# Patient Record
Sex: Female | Born: 1970 | Race: White | Hispanic: Yes | Marital: Married | Smoking: Never smoker
Health system: Southern US, Community
[De-identification: ages and names within clinical notes are randomized; demographics above are authoritative.]

## PROBLEM LIST (undated history)

## (undated) DIAGNOSIS — O24419 Gestational diabetes mellitus in pregnancy, unspecified control: Secondary | ICD-10-CM

## (undated) HISTORY — PX: NO PAST SURGERIES: SHX2092

## (undated) HISTORY — DX: Gestational diabetes mellitus in pregnancy, unspecified control: O24.419

---

## 2005-07-04 ENCOUNTER — Inpatient Hospital Stay (HOSPITAL_COMMUNITY): Admission: AD | Admit: 2005-07-04 | Discharge: 2005-07-06 | Payer: Self-pay | Admitting: Obstetrics & Gynecology

## 2011-12-22 NOTE — L&D Delivery Note (Signed)
Delivery Note Pt is a 40 y.o. [id number] and [redacted]w[redacted]d presenting to [facility] this morning with unexplained fever and fetal tachycardia. Her cervix was favorable, and she is a diet controlled gestational diabetic. Therefore she was admitted for induction with pitocin. She progressed well with low dose pitocin. An epidural was placed and pt went on to deliver vaginally. Fluid was bloody on AROM just before delivery and several clots were passed with delivery of the head.  Pt remained afebrile during labor. At 7:42 PM a viable and healthy female was delivered via SVD (Presentation: OA).  APGAR: 8, 9; weight 5 lb 14.4 oz (2676 g).   Placenta status: some small clots, spontaneous delivery, intact, sent for path.  Cord: normal.  Complications: none.  Cord pH: n/a.   Anesthesia:  epidural Episiotomy: none Lacerations: none Suture Repair: n/a Est. Blood Loss (mL):   Mom to postpartum.  Baby to nursery-stable.  Napoleon Form 08/02/2012, 7:55 PM

## 2012-04-25 ENCOUNTER — Other Ambulatory Visit (HOSPITAL_COMMUNITY): Payer: Self-pay | Admitting: Physician Assistant

## 2012-04-25 DIAGNOSIS — Z3689 Encounter for other specified antenatal screening: Secondary | ICD-10-CM

## 2012-04-25 LAB — OB RESULTS CONSOLE HGB/HCT, BLOOD: Hemoglobin: 11.2 g/dL

## 2012-04-25 LAB — OB RESULTS CONSOLE ABO/RH: RH Type: POSITIVE

## 2012-04-25 LAB — OB RESULTS CONSOLE VARICELLA ZOSTER ANTIBODY, IGG: Varicella: IMMUNE

## 2012-04-25 LAB — OB RESULTS CONSOLE HIV ANTIBODY (ROUTINE TESTING): HIV: NONREACTIVE

## 2012-04-25 LAB — OB RESULTS CONSOLE PLATELET COUNT: Platelets: 184 10*3/uL

## 2012-04-25 LAB — OB RESULTS CONSOLE GC/CHLAMYDIA: Gonorrhea: NEGATIVE

## 2012-04-25 LAB — OB RESULTS CONSOLE ANTIBODY SCREEN: Antibody Screen: NEGATIVE

## 2012-04-25 LAB — GLUCOSE TOLERANCE, 1 HOUR: Glucose, 1 Hour GTT: 164

## 2012-04-27 ENCOUNTER — Ambulatory Visit (HOSPITAL_COMMUNITY)
Admission: RE | Admit: 2012-04-27 | Discharge: 2012-04-27 | Disposition: A | Discharge status: Home / Self Care | Payer: Medicaid Other | Source: Ambulatory Visit | Attending: Physician Assistant | Admitting: Physician Assistant

## 2012-04-27 DIAGNOSIS — O09529 Supervision of elderly multigravida, unspecified trimester: Secondary | ICD-10-CM | POA: Insufficient documentation

## 2012-04-27 DIAGNOSIS — O3680X0 Pregnancy with inconclusive fetal viability, not applicable or unspecified: Secondary | ICD-10-CM | POA: Insufficient documentation

## 2012-04-27 DIAGNOSIS — Z1389 Encounter for screening for other disorder: Secondary | ICD-10-CM | POA: Insufficient documentation

## 2012-04-27 DIAGNOSIS — Z3689 Encounter for other specified antenatal screening: Secondary | ICD-10-CM

## 2012-04-27 DIAGNOSIS — O358XX0 Maternal care for other (suspected) fetal abnormality and damage, not applicable or unspecified: Secondary | ICD-10-CM | POA: Insufficient documentation

## 2012-04-27 DIAGNOSIS — Z363 Encounter for antenatal screening for malformations: Secondary | ICD-10-CM | POA: Insufficient documentation

## 2012-04-29 ENCOUNTER — Other Ambulatory Visit (HOSPITAL_COMMUNITY): Payer: Self-pay | Admitting: Physician Assistant

## 2012-04-29 DIAGNOSIS — Z3689 Encounter for other specified antenatal screening: Secondary | ICD-10-CM

## 2012-04-29 LAB — GLUCOSE, 3 HOUR GESTATIONAL: Glucose, GTT - 2 Hour: 183 mg/dL — AB (ref ?–140)

## 2012-05-05 ENCOUNTER — Other Ambulatory Visit (HOSPITAL_COMMUNITY): Payer: Self-pay | Admitting: Physician Assistant

## 2012-05-05 ENCOUNTER — Ambulatory Visit (HOSPITAL_COMMUNITY)
Admission: RE | Admit: 2012-05-05 | Discharge: 2012-05-05 | Disposition: A | Discharge status: Home / Self Care | Payer: Medicaid Other | Source: Ambulatory Visit | Attending: Physician Assistant | Admitting: Physician Assistant

## 2012-05-05 DIAGNOSIS — Z3689 Encounter for other specified antenatal screening: Secondary | ICD-10-CM

## 2012-05-05 DIAGNOSIS — O0930 Supervision of pregnancy with insufficient antenatal care, unspecified trimester: Secondary | ICD-10-CM | POA: Insufficient documentation

## 2012-05-05 DIAGNOSIS — O09529 Supervision of elderly multigravida, unspecified trimester: Secondary | ICD-10-CM | POA: Insufficient documentation

## 2012-05-09 ENCOUNTER — Encounter: Payer: Medicaid Other | Attending: Family Medicine | Admitting: Dietician

## 2012-05-09 ENCOUNTER — Ambulatory Visit (INDEPENDENT_AMBULATORY_CARE_PROVIDER_SITE_OTHER): Payer: Self-pay | Admitting: Family Medicine

## 2012-05-09 ENCOUNTER — Encounter: Payer: Self-pay | Admitting: Family Medicine

## 2012-05-09 VITALS — BP 122/75 | Temp 97.3°F | Ht 60.25 in | Wt 158.2 lb

## 2012-05-09 DIAGNOSIS — O99810 Abnormal glucose complicating pregnancy: Secondary | ICD-10-CM | POA: Insufficient documentation

## 2012-05-09 DIAGNOSIS — Z713 Dietary counseling and surveillance: Secondary | ICD-10-CM | POA: Insufficient documentation

## 2012-05-09 DIAGNOSIS — O24419 Gestational diabetes mellitus in pregnancy, unspecified control: Secondary | ICD-10-CM

## 2012-05-09 DIAGNOSIS — O0990 Supervision of high risk pregnancy, unspecified, unspecified trimester: Secondary | ICD-10-CM | POA: Insufficient documentation

## 2012-05-09 DIAGNOSIS — Z22330 Carrier of Group B streptococcus: Secondary | ICD-10-CM

## 2012-05-09 DIAGNOSIS — O99820 Streptococcus B carrier state complicating pregnancy: Secondary | ICD-10-CM | POA: Insufficient documentation

## 2012-05-09 LAB — POCT URINALYSIS DIP (DEVICE)
Ketones, ur: NEGATIVE mg/dL
Nitrite: NEGATIVE
Protein, ur: NEGATIVE mg/dL
pH: 7 (ref 5.0–8.0)

## 2012-05-09 NOTE — Patient Instructions (Signed)
Diabetes mellitus gestacional (Gestational Diabetes Mellitus) La diabetes mellitus gestacional se produce slo durante el embarazo. Aparece cuando el organismo no puede controlar adecuadamente la glucosa (azcar) que aumenta en la sangre despus de comer. Durante el embarazo, se produce una resistencia a la insulina (sensibilidad reducida a la insulina) debido a la liberacin de hormonas por parte de la placenta. Generalmente, el pncreas de una mujer embarazada produce la cantidad suficiente de insulina para vencer esa resistencia. Sin embargo, en la diabetes gestacional, hay insulina pero no cumple su funcin adecuadamente. Si la resistencia es lo suficientemente grave como para que el pncreas no produzca la cantidad de insulina suficiente, la glucosa extra se acumula en la sangre.  QUINES TIENEN RIESGO DE DESARROLLAR DIABETES GESTACIONAL?  Las mujeres con historia de diabetes en la familia.   Las mujeres de ms de 25 aos.   Las que presentan sobrepeso.   Las mujeres que pertenecen a ciertos grupos tnicos (latinas, afroamericanas, norteamericanas nativas, asiticas y las originarias de las islas del [employer].  QUE PUEDE OCURRIRLE AL BEB? Si el nivel de glucosa en sangre de la madre es demasiado elevado mientras este embarazada, el nivel extra de azcar pasar por el cordn umbilical hacia el beb. Algunos de los problemas del beb pueden ser:  Beb demasiado grande: si el nio recibe demasiada azcar, puede aumentar mucho de peso. Esto puede hacer que sea demasiado grande para nacer por parto normal (vaginal) por lo que ser necesario realizar una cesrea.   Bajo nivel de glucosa (hipoglucemia): el beb produce insulina extra en respuesta a la excesiva cantidad de azcar que obtiene de la madre. Cuando el beb nace y ya no necesita insulina extra, su nivel de azcar en sangre puede disminuir.   Ictericia (coloracin amarillenta de la piel y los ojos): esto es bastante frecuente en los  bebs. La causa es la acumulacin de una sustancia qumica denominada bilirrubina. No siempre es un trastorno grave, pero se observa con frecuencia en los bebs cuyas madres sufren diabetes gestacional.  RIESGOS PARA LA MADRE Las mujeres que han sufrido diabetes gestacional pueden tener ms riesgos para algunos problemas como:  Preeclampsia o toxemia, incluyendo problemas con hipertensin arterial. La presin arterial y los niveles de protenas en la orina deben controlarse con frecuencia.   Infecciones   Parto por cesrea.   Aparicin de diabetes tipo 2 en una etapa posterior de la vida. Alrededor del 30% al 50% sufrir diabetes posteriormente, especialmente las que son obesas.  DIAGNSTICO Las hormonas que causan resistencia a la insulina tienen su mayor nivel alrededor de las 24 a 28 semanas del embarazo. Si se experimentan sntomas, stos son similares a los sntomas que normalmente aparecen durante el embarazo.  La diabetes mellitus gestacional generalmente se diagnostica por medio de un mtodo en dos partes: 1. Despus de la 24 a 28 semanas de embarazo, la mujer debe beber una solucin que contiene glucosa y realizar un anlisis de sangre. Si el nivel de glucosa es elevado, la realizarn un segundo anlisis.  2. La prueba oral de tolerancia a la glucosa, que dura aproximadamente tres horas. Despus de realizar ayuno durante la noche, se controla nivel de glucosa en sangre. La mujer bebe una solucin que contiene glucosa y le realizan anlisis de glucosa en sangre cada hora.  Si la mujer tiene factores de riesgos para la diabetes mellitus gestacional, el mdico podr indicar el anlisis antes de las 24 semanas de embarazo. TRATAMIENTO El tratamiento est dirigido a mantener la glucosa en   sangre de la madre en un nivel normal y puede incluir:  La planificacin de los alimentos.   Recibir insulina u otro medicamento para controlar el nivel de glucosa en sangre.   La prctica de ejercicios.    Llevar un registro diario de los alimentos que consume.   Control y registro de los niveles de glucosa en sangre.   Control de los niveles de cetona en la orina, aunque esto ya no se considera necesario en la mayora de los embarazos.  INSTRUCCIONES PARA EL CUIDADO DOMICILIARIO Mientras est embarazada:  Siga los consejos de su mdico relacionados con los controles prenatales, la planificacin de la comida, la actividad fsica, los medicamentos, vitaminas, los anlisis de sangre y otras pruebas y las actividades fsicas.   Lleve un registro de las comidas, las pruebas de glucosa en sangre y la cantidad de insulina que recibe (si corresponde). Muestre todo al profesional en cada consulta mdica prenatal.   Si sufre diabetes mellitus gestacional, podr tener problemas de hipoglucemia (nivel bajo de glucosa en sangre). Podr sospechar este problema si se siente repentinamente mareada, tiene temblores y/o se siente dbil. Si cree que esto le est ocurriendo, y tiene un medidor de glucosa, mida su nivel de glucosa en sangre. Siga los consejos de su mdico sobre el modo y el momento de tratar su nivel de glucosa en sangre. Generalmente se sigue la regla 15:15 Consuma 15 g de hidratos de carbono, espere 15 minutos y vuelva controlar el nivel de glucosa en sangre.. Ejemplos de 15 g de hidratos de carbono son:   1 taza de leche descremada.    taza de jugo.   3-4 tabletas de glucosa.   5-6 caramelos duros.   1 caja pequea de pasas de uva.    taza de gaseosa comn.   Mantenga una buena higiene para evitar infecciones.   No fume.  SOLICITE ATENCIN MDICA SI:  Observa prdida vaginal con o sin picazn.   Se siente ms dbil o cansada que lo habitual.   Transpira mucho.   Tiene un aumento de peso repentino, 2,5 kg o ms en una semana.   Pierde peso, 1.5 kg o ms en una semana.   Su nivel de glucosa en sangre es elevado, necesita instrucciones.  SOLICITE ATENCIN MDICA DE  INMEDIATO SI:  Sufre una cefalea intensa.   Se marea o pierde el conocimiento   Presenta nuseas o vmitos.   Se siente desorientada confundida.   Sufre convulsiones.   Tiene problemas de visin.   Siente dolor en el estmago.   Presenta una hemorragia vaginal abundante.   Tiene contracciones uterinas.   Tiene una prdida importante de lquido por la vagina  DESPUS QUE NACE EL BEB:  Concurra a todos los controles de seguimiento y realice los anlisis de sangre segn las indicaciones de su mdico.   Mantenga un estilo de vida saludable para evitar la diabetes en el futuro. Aqu se incluye:   Siga el plan de alimentacin saludable.   Controle su peso.   Practique actividad fsica y descanse lo necesario.   No fume.   Amamante a su beb mientras pueda. Esto disminuir la probabilidad de que usted y su beb sufran diabetes posteriormente.  Para ms informacin acerca de la diabetes, visite la pgina web de la American Diabetes Association: www.americandiabetesassociation.org. Para ms informacin acerca de la diabetes gestacional cite la pgina web del American Congress of Obstetricians and Gynecologists en: www.acog.org. Document Released: 09/16/2005 Document Revised: 11/26/2011 ExitCare Patient Information 2012   [employer]. 

## 2012-05-09 NOTE — Progress Notes (Signed)
Nutrition Note:  (1st visit consult) Pt seen for 1st [facility] visit and GDM diet education. Pt newly dx GDM, overweight prior to pregnancy. Current wt gain of 4.2# is around 4#< expected at [redacted]w[redacted]d. Pt given extensive GDM diet education with interpreter Chip Boer. Disc importance of 3 meals and 3 snacks and CHO-protein intake.  Pt reports typical intake of 3 meals and fruits for snacks.   Agrees to make necessary changes to follow GDM diet.  Disc wt gain goals of 15-25# and importance of increased physical activity.   Pt does receive WIC services and plans to BF. Follow up in 4-6 weeks.  Cy Blamer, RD

## 2012-05-09 NOTE — Progress Notes (Signed)
Diabetes Education:  Completed review of glucose testing for GDM with the assistance of the Spanish interpreter.  Provided a True Track Meter and 1 box strips and 1 box lancets.  Kit ZOX:[id number] EXP: 2013/07/20  Strips JWJ:[id number] Exp: 2014/05/18, Lancets: Lot [id number] Exp:2016/03/31.  Return demonstration revealed a fasting glucose of 94 mg/dl at 8:65 AM. Instructed to test fasting and 2 hr post-meal glucose levels and to bring meter and glucose log to each clinic appointment.  Maggie Eithel Ryall, RN, RD, CDE.           Ex[p: 2016/03/31

## 2012-05-09 NOTE — Progress Notes (Signed)
   Subjective:    Joan Poole is a [id number] [redacted]w[redacted]d being seen today for her first obstetrical visit - [facility] 3hr [facility]TT.  Patient doing well.  Has no complaints.  Breast and bottle feeding.  Pregnancy history fully reviewed.  Patient reports no bleeding, no contractions, no cramping and no leaking.  Filed Vitals:   05/09/12 0811 05/09/12 0812  BP: 122/75   Temp: 97.3 F (36.3 C)   Height:  5' 0.25" (1.53 m)  Weight: 158 lb 3.2 oz (71.759 kg)     HISTORY: OB History    Grav Para Term Preterm Abortions TAB SAB Ect Mult Living   4 3 3       3      # Outc Date GA Lbr Len/2nd Wgt Sex Del Anes PTL Lv   1 TRM 1/98 [redacted]w[redacted]d  6lb9.8oz(3kg) F SVD EPI  Yes   2 TRM 9/01 [redacted]w[redacted]d  7lb15oz(3.6kg) M SVD EPI  Yes   3 TRM 7/06 [redacted]w[redacted]d 01:00 7lb(3.175kg) M SVD EPI  Yes   4 CUR              Past Medical History  Diagnosis Date  . Gestational diabetes    History reviewed. No pertinent past surgical history. History reviewed. No pertinent family history.   Exam    Uterus:  Fundal Height: 26 cm       Skin: normal coloration and turgor, no rashes    Neurologic: oriented, normal, oriented, normal mood   Extremities: normal strength, tone, and muscle mass   HEENT extra ocular movement intact   Abdomen: soft, non-tender; bowel sounds normal; no masses,  no organomegaly          Assessment:    Pregnancy: [id number] Patient Active Problem List  Diagnoses  . Group B Streptococcus carrier, antepartum  . Gestational diabetes mellitus, antepartum  . Supervision of high-risk pregnancy        Plan:     Initial labs drawn. Prenatal vitamins. Problem list reviewed and updated. Fetal survey normal anatomy  Follow up in 2 weeks. 50% of 45 min visit spent on counseling and coordination of care.  Patient to see social work, diabetes nurse and nutritionist.   Joan Poole JEHIEL 05/09/2012

## 2012-05-09 NOTE — Progress Notes (Signed)
Pulse- 107  Vaginal discharge- clear "has been clear this pregnancy"

## 2012-05-23 ENCOUNTER — Encounter: Payer: Medicaid Other | Attending: Family Medicine | Admitting: Dietician

## 2012-05-23 ENCOUNTER — Ambulatory Visit (INDEPENDENT_AMBULATORY_CARE_PROVIDER_SITE_OTHER): Payer: Self-pay | Admitting: Obstetrics & Gynecology

## 2012-05-23 VITALS — BP 117/74 | Temp 98.1°F | Wt 156.0 lb

## 2012-05-23 DIAGNOSIS — O0990 Supervision of high risk pregnancy, unspecified, unspecified trimester: Secondary | ICD-10-CM

## 2012-05-23 DIAGNOSIS — O99810 Abnormal glucose complicating pregnancy: Secondary | ICD-10-CM | POA: Insufficient documentation

## 2012-05-23 DIAGNOSIS — Z713 Dietary counseling and surveillance: Secondary | ICD-10-CM | POA: Insufficient documentation

## 2012-05-23 DIAGNOSIS — O24419 Gestational diabetes mellitus in pregnancy, unspecified control: Secondary | ICD-10-CM

## 2012-05-23 LAB — POCT URINALYSIS DIP (DEVICE)
Ketones, ur: NEGATIVE mg/dL
Protein, ur: NEGATIVE mg/dL
Specific Gravity, Urine: 1.015 (ref 1.005–1.030)
Urobilinogen, UA: 0.2 mg/dL (ref 0.0–1.0)
pH: 7 (ref 5.0–8.0)

## 2012-05-23 MED ORDER — PRENATAL 27-0.8 MG PO TABS: 1.0000 | ORAL_TABLET | Freq: Every day | ORAL | Status: DC

## 2012-05-23 NOTE — Patient Instructions (Signed)
Eleccin del mtodo anticonceptivo  (Contraception Choices) La anticoncepcin (control de la natalidad) es el uso de cualquier mtodo o dispositivo para evitar el embarazo. A continuacin se indican algunos de esos mtodos.  MTODOS HORMONALES   Implante anticonceptivo. Es un tubo plstico delgado que contiene la hormona progesterona. No contiene estrgenos. El mdico inserta el tubo en la parte interna del brazo. El tubo puede permanecer en el lugar durante 3 aos. Despus de los 3 aos debe retirarse. El implante impide que los ovarios liberen vulos (ovulacin), espesa el moco cervical, lo que evita que los espermatozoides ingresen al tero y hace ms delgada la membrana que cubre el interior del tero.   Inyecciones de progesterona sola. Estas inyecciones se administran cada 3 meses para evitar el embarazo. La progesterona sinttica impide que los ovarios liberen vulos. Tambin hace que el moco cervical se espese y modifica el recubrimiento interno del tero. Esto hace ms difcil que los espermatozoides sobrevivan en el tero.   Pldoras anticonceptivas. Las pldoras anticonceptivas contienen estrgenos y progesterona. Actan impidiendo que el vulo se forme en el ovario(ovulacin). Las pldoras anticonceptivas son recetadas por el mdico.Tambin se utilizan para tratar los perodos menstruales abundantes.   Minipldora. Este tipo de pldora anticonceptiva contiene slo hormona progesterona. Deben tomarse todos los das del mes y debe recetarlas el mdico.   Parches anticonceptivos. El parche contiene hormonas similares a las que contienen las pldoras anticonceptivas. Deben cambiarse una vez por semana y se utilizan bajo prescripcin mdica.   Anillo vaginal. Anillo vaginal contiene hormonas similares a las que contienen las pldoras anticonceptivas. Se deja colocado durante tres semanas, se lo retira durante 1 semana y luego se coloca uno nuevo. La paciente debe sentirse cmoda para insertar  y retirar el anillo de la vagina.Es necesaria la receta del mdico.   Anticonceptivos de emergencia. Los anticonceptivos de emergencia son mtodos para evitar un embarazo despus de una relacin sexual sin proteccin. Esta pldora puede tomarse inmediatamente despus de tener relaciones sexuales o hasta 5 das de haber tenido sexo sin proteccin. Es ms efectiva si se toma poco tiempo despus. Los anticonceptivos de emergencia estn disponibles sin prescripcin mdica. Consltelo con su farmacutico. No use los anticonceptivos de emergencia como nico mtodo anticonceptivo.  MTODOS DE BARRERA   Condn masculino. Es una vaina delgada (ltex o goma) que se usa en el pene durante el acto sexual. Puede usarse con espermicida para aumentar la efectividad.   Condn femenino. Es una vaina blanda y floja que se adapta suavemente a la vagina antes de las relaciones sexuales.   Diafragma. Es una barrera de ltex redonda y suave que debe ser ajustada por un profesional. Se inserta en la vagina, junto con un gel espermicida. Debe insertarse antes de tener relaciones sexuales. Debe dejar el diafragma colocado en la vagina durante 6 a 8 horas despus de la relacin sexual.   Capuchn cervical. Es una taza de ltex o plstico, redonda y suave que cubre el cuello del tero y debe ser ajustada por un mdico. Puede dejarlo colocado en la vagina hasta 48 horas despus de las relaciones sexuales.   Esponja. Es una pieza blanda y circular de espuma de poliuretano. Contiene un espermicida. Se inserta en la vagina despus de mojarla y antes de las relaciones sexuales.   Espermicidas. Los espermicidas son qumicos que matan o bloquean el esperma y no lo dejan ingresar al cuello del tero y al tero. Vienen en forma de cremas, geles, supositorios, espuma o comprimidos. No es   necesario tener receta mdica. Se insertan en la vagina con un aplicador antes de tener relaciones sexuales. El proceso debe repetirse cada vez que  tiene relaciones sexuales.  ANTICONCEPTIVOS INTRAUTERINOS   Dispositivo intrauterino (DIU). Es un dispositivo en forma de T que se coloca en el tero durante el perodo menstrual, para evitar el embarazo. Hay dos tipos:   DIU de cobre. Este tipo de DIU est recubierto con un alambre de cobre y se inserta dentro del tero. El cobre hace que el tero y las trompas de Falopio produzcan un liquido que destruye los espermatozoides. Puede permanecer colocado durante 10 aos.   DIU hormonal. Este tipo de DIU contiene la hormona progestina (progesterona sinttica). La hormona espesa el moco cervical y evita que los espermatozoides ingresen al tero y tambin afina la membrana que cubre el tero para evitar la implantacin del vulo fertilizado. La hormona debilita o destruye los espermatozoides que ingresan al tero. Puede permanecer colocado durante 5 aos.  MTODOS ANTICONCEPTIVOS PERMANENTES   Ligadura de trompas en la mujer. La ligadura de trompas en la mujer se realiza sellando, atando u obstruyendo quirrgicamente las trompas de Falopio lo que impide que el vulo descienda hacia el tero.   Esterilizacin masculina. Se realiza atando los conductos por los que pasan los espermatozoides (vasectoma).Esto impide que el esperma ingrese a la vagina durante el acto sexual. Luego del procedimiento, el hombre puede eyacular lquido (semen).  MTODOS DE PLANIFICACIN NATURAL   Planificacin familiar natural.  Consiste en no tener relaciones sexuales o usar un mtodo de barrera (condn, diafragma, capuchn cervical) en los das que la mujer podra quedar embarazada.   Mtodo calendario.  Consiste en el seguimiento de la duracin de cada ciclo menstrual y la identificacin de los perodos frtiles.   Mtodo de la ovulacin.  Consiste en evitar las relaciones sexuales durante la ovulacin.   Mtodo sintotrmico. Consiste en evitar las relaciones sexuales en la poca en la que se est ovulando, utilizando un  termmetro y tendiendo en cuenta los sntomas de la ovulacin.   Mtodo post-ovulacin. Consiste en planificar las relaciones sexuales para despus de haber ovulado.  Independientemente del tipo o mtodo anticonceptivo que usted elija, es importante que use condones para protegerse contra las enfermedades de transmisin sexual (ETS). Hable con su mdico con respecto a qu mtodo anticonceptivo es el ms apropiado para usted.  Document Released: 12/07/2005 Document Revised: 11/26/2011 Exit[employer] Patient Information 2012 [employer]. 

## 2012-05-23 NOTE — Progress Notes (Incomplete)
Diabetes Education:  Provided 1 box of lancets.  U:[id number]  EXP:2016/03/31.  We are out of the strips for the True Track meter at this time.  Maggie Jashawn Floyd, RN, RD, CDE

## 2012-05-23 NOTE — Progress Notes (Signed)
Pulse: 97 Needs refill on PNV

## 2012-05-23 NOTE — Progress Notes (Signed)
Pt ran out of strips 05/22/12.  Fastings all nml;  2 hr--wnl .  CBG today--78

## 2012-05-23 NOTE — Progress Notes (Signed)
Addended by: Lesly Dukes on: 05/23/2012 12:28 PM   Modules accepted: Orders

## 2012-06-06 ENCOUNTER — Ambulatory Visit (INDEPENDENT_AMBULATORY_CARE_PROVIDER_SITE_OTHER): Payer: Medicaid Other | Admitting: Family

## 2012-06-06 ENCOUNTER — Encounter: Payer: Self-pay | Admitting: Family

## 2012-06-06 VITALS — BP 116/63 | Temp 98.7°F | Wt 154.8 lb

## 2012-06-06 DIAGNOSIS — O99810 Abnormal glucose complicating pregnancy: Secondary | ICD-10-CM

## 2012-06-06 DIAGNOSIS — O24419 Gestational diabetes mellitus in pregnancy, unspecified control: Secondary | ICD-10-CM

## 2012-06-06 DIAGNOSIS — O0990 Supervision of high risk pregnancy, unspecified, unspecified trimester: Secondary | ICD-10-CM

## 2012-06-06 LAB — POCT URINALYSIS DIP (DEVICE)
Bilirubin Urine: NEGATIVE
Glucose, UA: NEGATIVE mg/dL
Hgb urine dipstick: NEGATIVE
Ketones, ur: NEGATIVE mg/dL
Nitrite: NEGATIVE
Protein, ur: NEGATIVE mg/dL
Specific Gravity, Urine: 1.015 (ref 1.005–1.030)
Urobilinogen, UA: 0.2 mg/dL (ref 0.0–1.0)
pH: 7 (ref 5.0–8.0)

## 2012-06-06 NOTE — Progress Notes (Signed)
Pulse: 90

## 2012-06-06 NOTE — Progress Notes (Signed)
FBS 78-85 2 hr PP 80-126 (1 abnormal);  No questions or concerns; doing well; urine culture for small leuk

## 2012-06-08 LAB — CULTURE, OB URINE

## 2012-06-09 ENCOUNTER — Other Ambulatory Visit: Payer: Self-pay | Admitting: Family

## 2012-06-09 DIAGNOSIS — O0990 Supervision of high risk pregnancy, unspecified, unspecified trimester: Secondary | ICD-10-CM

## 2012-06-09 MED ORDER — AMPICILLIN 500 MG PO CAPS: 500.0000 mg | ORAL_CAPSULE | Freq: Three times a day (TID) | ORAL | Status: AC

## 2012-06-20 ENCOUNTER — Ambulatory Visit (INDEPENDENT_AMBULATORY_CARE_PROVIDER_SITE_OTHER): Payer: Medicaid Other | Admitting: Physician Assistant

## 2012-06-20 ENCOUNTER — Encounter: Payer: Medicaid Other | Attending: Family Medicine | Admitting: Dietician

## 2012-06-20 VITALS — BP 110/69 | Temp 99.0°F | Wt 152.3 lb

## 2012-06-20 DIAGNOSIS — O99810 Abnormal glucose complicating pregnancy: Secondary | ICD-10-CM

## 2012-06-20 DIAGNOSIS — O24419 Gestational diabetes mellitus in pregnancy, unspecified control: Secondary | ICD-10-CM

## 2012-06-20 DIAGNOSIS — Z713 Dietary counseling and surveillance: Secondary | ICD-10-CM | POA: Insufficient documentation

## 2012-06-20 LAB — POCT URINALYSIS DIP (DEVICE)
Glucose, UA: NEGATIVE mg/dL
Ketones, ur: NEGATIVE mg/dL
Specific Gravity, Urine: 1.025 (ref 1.005–1.030)
Urobilinogen, UA: 1 mg/dL (ref 0.0–1.0)

## 2012-06-20 NOTE — Patient Instructions (Signed)
Embarazo - Tercer trimestre (Pregnancy - Third Trimester) El tercer trimestre del embarazo (los ltimos 3 meses) es el perodo de cambios ms rpidos que atraviesan usted y el beb. El aumento de peso es ms rpido. El beb alcanza un largo de aproximadamente 50 cm (20 pulgadas) y pesa entre 2,700 y 4,500 kg (6 a 10 libras). El beb gana ms tejido graso y ya est listo para la vida fuera del cuerpo de la madre. Mientras estn en el interior, los bebs tienen perodos de sueo y vigilia, succionan el pulgar y tienen hipo. Quizs sienta pequeas contracciones del tero. Este es el falso trabajo de parto. Tambin se las conoce como contracciones de Braxton-Hicks. Es como una prctica del parto. Los problemas ms habituales de esta etapa del embarazo incluyen mayor dificultad para respirar, hinchazn de las manos y los pies por retencin de lquidos y la necesidad de orinar con ms frecuencia debido a que el tero y el beb presionan sobre la vejiga.  EXAMENES PRENATALES  Durante los exmenes prenatales, deber seguir realizando pruebas de sangre, segn avance el embarazo. Estas pruebas se realizan para controlar su salud y la del beb. Tambin se realizan anlisis de sangre para conocer los niveles de hemoglobina. La anemia (bajo nivel de hemoglobina) es frecuente durante el embarazo. Para prevenirla, se administran hierro y vitaminas. Tambin le harn nuevas pruebas para descartar la diabetes. Podrn repetirle algunas de las pruebas que le hicieron previamente.   En cada visita le medirn el tamao del tero. Es para asegurarse de que el beb se desarrolla correctamente.   Tambin en cada visita la pesarn. Esto se realiza para asegurarse de que aumenta de peso al ritmo indicado y que usted y su beb evolucionan normalmente.   En algunas ocasiones se realiza una ecografa para confirmar el correcto desarrollo y evolucin del beb. Esta prueba se realiza con ondas sonoras inofensivas para el beb, de modo  que el profesional pueda calcular con ms precisin la fecha del parto.   Discuta las posibilidades de la anestesia si necesita cesrea.  Algunas veces se realizan pruebas especializadas del lquido amnitico que rodea al beb. Esta prueba se denomina amniocentesis. El lquido amnitico se obtiene introduciendo una aguja en el abdomen (vientre). En ocasiones se lleva a cabo cerca del final del embarazo, si es necesario adelantar el parto. En este caso se realiza para asegurarse de que los pulmones del beb estn lo suficientemente maduros como para que pueda vivir fuera del tero. CAMBIOS QUE OCURREN EN EL TERCER TRIMESTRE DEL EMBARAZO Su organismo atravesar diferentes cambios durante el embarazo que varan de una persona a otra. Converse con el profesional que la asiste acerca los cambios que usted note y que la preocupen.  Durante el ltimo trimestre probablemente sienta un aumento del apetito. Es normal tener "antojos" de ciertas comidas. Esto vara de una persona a otra y de un embarazo a otro.   Podrn aparecer las primeras estras en las caderas, abdomen y mamas. Estos son cambios normales del cuerpo durante el embarazo. No existen medicamentos ni ejercicios que puedan prevenir estos cambios.   El estreimiento puede tratarse con un laxante o agregando fibra a su dieta. Beber grandes cantidades de lquidos, tomar fibras en forma de verduras, frutas y granos integrales es de gran ayuda.   Tambin es beneficioso practicar actividad fsica. Si ha sido una persona activa hasta el embarazo, podr continuar con la mayora de las actividades durante el mismo. Si ha sido menos activa, puede ser beneficioso   que comience con un programa de ejercicios, como realizar caminatas. Consulte con el profesional que la asiste antes de comenzar un programa de ejercicios.   Evite el consumo de cigarrillos, el alcohol, los medicamentos no prescritos y las "drogas de la calle" durante el embarazo. Estas sustancias  qumicas afectan la formacin y el desarrollo del beb. Evite estas sustancias durante todo el embarazo para asegurar el nacimiento de un beb sano.   Dolor de espalda, venas varicosas y hemorroides podran aparecer o empeorar.   Los movimientos del beb pueden ser ms bruscos y aparecer ms a menudo.   Puede que note dificultades para respirar facilmente.   El ombligo podra salrsele hacia afuera.   Puede segregar un lquido amarillento (calostro) de las mamas.   Puede segregar mucus con sangre. Esto normalmente ocurre unos pocos das a una semana antes de que comience el trabajo de parto.  INSTRUCCIONES PARA EL CUIDADO DOMICILIARIO  La mayor parte de los cuidados que se aconsejan son los mismos que los indicados para las primeras etapas del embarazo. Es importante que concurra a todas las citas con el profesional y siga sus instrucciones con respecto a los medicamentos que deba utilizar, a la actividad fsica y a la dieta.   Durante el embarazo debe obtener nutrientes para usted y para su beb. Consuma alimentos balanceados a intervalos regulares. Elija alimentos como carne, pescado, leche y otros productos lcteos descremados, verduras, frutas, panes integrales y cereales. El profesional le informar cul es el aumento de peso ideal.   Las relaciones sexuales pueden continuarse hasta casi el final del embarazo, si no se presentan otros problemas como prdida prematura (antes de tiempo) de lquido amnitico, hemorragia vaginal o dolor abdominal (en el vientre).   Realice actividad fsica todos los das, si no tiene restricciones. Consulte con el profesional que la asiste si no sabe con certeza si determinados ejercicios son seguros. El mayor aumento de peso se produce en los dos ltimos trimestres del embarazo.   Haga reposo con frecuencia, con las piernas elevadas, o segn lo necesite para evitar los calambres y el dolor de cintura.   Use un buen sostn o como los que se usan para hacer  deportes para aliviar la sensibilidad de las mamas. Tambin puede serle til si lo usa mientras duerme. Si pierde calostro, podr utilizar apsitos en el sostn.   No utilice la baera con agua caliente, baos turcos y saunas.   Colquese el cinturn de seguridad cuando conduzca. Este la proteger a usted y al beb en caso de accidente.   Evite comer carne cruda y el contacto con los utensilios y desperdicios de los gatos. Estos elementos contienen grmenes que pueden causar defectos de nacimiento en el beb.   Es fcil perder algo de orina durante el embarazo. Apretar y fortalecer los msculos de la pelvis la ayudar con este problema. Practique detener la miccin cuando est en el bao. Estos son los mismos msculos que necesita fortalecer. Son tambin los mismos msculos que utiliza cuando trata de evitar los gases. Puede practicar apretando estos msculos diez veces, y repetir esto tres veces por da aproximadamente. Una vez que conozca qu msculos debe contraer, no realice estos ejercicios durante la miccin. Puede favorecerle una infeccin si la orina vuelve hacia atrs.   Pida ayuda si tiene necesidades econmicas, de asesoramiento o nutricionales durante el embarazo. El profesional podr ayudarla con respecto a estas necesidades, o derivarla a otros especialistas.   Practique la ida hasta el hospital a modo   de prueba.   Tome clases prenatales junto con su pareja para comprender, practicar y hacer preguntas acerca del trabajo de parto y el nacimiento.   Prepare la habitacin del beb.   No viaje fuera de la ciudad a menos que sea absolutamente necesario y con el consejo del mdico.   Use slo zapatos bajos sin taco para tener un mejor equilibrio y prevenir cadas.  EL CONSUMO DE MEDICAMENTOS Y DROGAS DURANTE EL EMBARAZO  Contine tomando las vitaminas apropiadas para esta etapa tal como se le indic. Las vitaminas deben contener un miligramo de cido flico y deben suplementarse con  hierro. Guarde todas las vitaminas fuera del alcance de los nios. La ingestin de slo un par de vitaminas o comprimidos que contengan hierro pueden ocasionar la muerte en un beb o en un nio pequeo.   Evite el uso de medicamentos, inclusive los de venta libre, que no hayan sido prescritos o indicados por el profesional que la asiste. Algunos medicamentos pueden causar problemas fsicos al beb. Utilice los medicamentos de venta libre o de prescripcin para el dolor, el malestar o la fiebre, segn se lo indique el profesional que lo asiste. No utilice aspirina, ibuprofeno (Motrin, Advil, Nuprin) o naproxeno (Aleve) a menos que el profesional la autorice.   El alcohol se asocia a cierto nmero de defectos del nacimiento, incluido el sndrome de alcoholismo fetal. Debe evitar el consumo de alcohol en cualquiera de sus formas. El cigarrillo causa nacimientos prematuros y bebs de bajo peso al nacer. Las drogas de la calle son muy nocivas para el beb y estn absolutamente prohibidas. Un beb que nace de una madre adicta, ser adicto al nacer. Ese beb tendr los mismos sntomas de abstinencia que un adulto.   Infrmele al profesional si consume alguna droga.  SOLICITE ATENCIN MDICA SI: Tiene alguna preocupacin durante el embarazo. Es mejor que llame para formular las preguntas si no puede esperar hasta la prxima visita, que sentirse preocupada por ellas.  DECISIONES ACERCA DE LA CIRCUNCISIN Usted puede saber o no cul es el sexo de su beb. Si es un varn, ste es el momento de pensar acerca de la circuncisin. La circuncisin es la extirpacin del prepucio. Esta es la piel que cubre el extremo sensible del pene. No hay un motivo mdico que lo justifique. Generalmente la decisin se toma segn lo que sea popular en ese momento, o se basa en creencias religiosas. Podr conversar estos temas con el profesional que la asiste. SOLICITE ATENCIN MDICA DE INMEDIATO SI:  La temperatura oral se eleva  sin motivo por encima de 102 F (38.9 C) o segn le indique el profesional que la asiste.   Tiene una prdida de lquido por la vagina (canal de parto). Si sospecha una ruptura de las membranas, tmese la temperatura y llame al profesional para informarlo sobre esto.   Observa unas pequeas manchas, una hemorragia vaginal o elimina cogulos. Avsele al profesional acerca de la cantidad y de cuntos apsitos est utilizando.   Presenta un olor desagradable en la secrecin vaginal y observa un cambio en el color, de transparente a blanco.   Ha vomitado durante ms de 24 horas.   Presenta escalofros o fiebre.   Comienza a sentir falta de aire.   Siente ardor al orinar.   [place] o sube ms de 900 g (ms de 2 libras), o segn lo indicado por el profesional que la asiste. Observa que sbitamente se le hinchan el rostro, las manos, los pies o las   piernas.   Presenta dolor abdominal. Las molestias en el ligamento redondo son una causa benigna (no cancerosa) frecuente de dolor abdominal durante el embarazo, pero el profesional que la asiste deber evaluarlo.   Presenta dolor de cabeza intenso que no se alivia.   Si no siente los movimientos del beb durante ms de tres horas. Si piensa que el beb no se mueve tanto como lo haca habitualmente, coma algo que contenga azcar y recustese sobre el lado izquierdo durante una hora. El beb debe moverse al menos 4  5 veces por hora. Comunquese inmediatamente si el beb se mueve menos que lo indicado.   Se cae, se ve involucrada en un accidente automovilstico o sufre algn tipo de traumatismo.   En su hogar hay violencia mental o fsica.  Document Released: 09/16/2005 Document Revised: 11/26/2011 [employer] Patient Information 2012 [employer]. 

## 2012-06-20 NOTE — Progress Notes (Signed)
Diabetes Education:  Request strips and lancets.  States that since she comes only every 2 weeks, the 50 strips are running out at day 12.  Provided 2 canisters of strips to last the 13-14 days until the next visit.  RUE:[id number]  EXP: 2014/09/19  And LOT: [id number] EXP: 2014/09/19 Provided Lancets LOT: [id number] EXP: 2016/05/23 Maggie Catcher Dehoyos RN, RD, CDE.

## 2012-06-20 NOTE — Progress Notes (Signed)
U/S scheduled July 11, 2012 at 845 am.

## 2012-06-20 NOTE — Progress Notes (Signed)
Pulse: 89

## 2012-06-20 NOTE — Progress Notes (Signed)
No complaints. Reviewed + urine culture (GBS) pt has ABX with her but has not started taking. Instructed to begin today and take as prescribed. All BS great. FBS<88, 2PP<120 (143). Diet controlling sugars well. Will obtain FU growth [place] ~36 weeks.

## 2012-07-04 ENCOUNTER — Ambulatory Visit (INDEPENDENT_AMBULATORY_CARE_PROVIDER_SITE_OTHER): Payer: Medicaid Other | Admitting: Obstetrics & Gynecology

## 2012-07-04 VITALS — BP 108/68 | Temp 97.4°F | Wt 151.4 lb

## 2012-07-04 DIAGNOSIS — O99820 Streptococcus B carrier state complicating pregnancy: Secondary | ICD-10-CM

## 2012-07-04 DIAGNOSIS — Z22330 Carrier of Group B streptococcus: Secondary | ICD-10-CM

## 2012-07-04 DIAGNOSIS — O99810 Abnormal glucose complicating pregnancy: Secondary | ICD-10-CM

## 2012-07-04 DIAGNOSIS — O0990 Supervision of high risk pregnancy, unspecified, unspecified trimester: Secondary | ICD-10-CM

## 2012-07-04 DIAGNOSIS — O24419 Gestational diabetes mellitus in pregnancy, unspecified control: Secondary | ICD-10-CM

## 2012-07-04 LAB — POCT URINALYSIS DIP (DEVICE)
Glucose, UA: NEGATIVE mg/dL
Hgb urine dipstick: NEGATIVE
Ketones, ur: NEGATIVE mg/dL
Specific Gravity, Urine: 1.02 (ref 1.005–1.030)
Urobilinogen, UA: 2 mg/dL — ABNORMAL HIGH (ref 0.0–1.0)

## 2012-07-04 NOTE — Progress Notes (Signed)
Fasting71-84;  Ony one abnml pp at 133.  Pt doing well on diet.  Pt can come back in 2 weeks.  Will do cultures at that visit.  All questions answered with interpreter present.

## 2012-07-04 NOTE — Progress Notes (Signed)
Pulse: 100

## 2012-07-11 ENCOUNTER — Ambulatory Visit (HOSPITAL_COMMUNITY)
Admission: RE | Admit: 2012-07-11 | Discharge: 2012-07-11 | Disposition: A | Discharge status: Home / Self Care | Payer: Self-pay | Source: Ambulatory Visit | Attending: Physician Assistant | Admitting: Physician Assistant

## 2012-07-11 DIAGNOSIS — O09529 Supervision of elderly multigravida, unspecified trimester: Secondary | ICD-10-CM | POA: Insufficient documentation

## 2012-07-11 DIAGNOSIS — O99810 Abnormal glucose complicating pregnancy: Secondary | ICD-10-CM | POA: Insufficient documentation

## 2012-07-11 DIAGNOSIS — O24419 Gestational diabetes mellitus in pregnancy, unspecified control: Secondary | ICD-10-CM

## 2012-07-18 ENCOUNTER — Ambulatory Visit (INDEPENDENT_AMBULATORY_CARE_PROVIDER_SITE_OTHER): Payer: Self-pay | Admitting: Advanced Practice Midwife

## 2012-07-18 VITALS — BP 113/66 | Temp 97.3°F | Wt 150.0 lb

## 2012-07-18 DIAGNOSIS — O99891 Other specified diseases and conditions complicating pregnancy: Secondary | ICD-10-CM

## 2012-07-18 DIAGNOSIS — O24419 Gestational diabetes mellitus in pregnancy, unspecified control: Secondary | ICD-10-CM

## 2012-07-18 DIAGNOSIS — O9989 Other specified diseases and conditions complicating pregnancy, childbirth and the puerperium: Secondary | ICD-10-CM

## 2012-07-18 DIAGNOSIS — O09899 Supervision of other high risk pregnancies, unspecified trimester: Secondary | ICD-10-CM

## 2012-07-18 DIAGNOSIS — O99820 Streptococcus B carrier state complicating pregnancy: Secondary | ICD-10-CM

## 2012-07-18 DIAGNOSIS — O0990 Supervision of high risk pregnancy, unspecified, unspecified trimester: Secondary | ICD-10-CM

## 2012-07-18 DIAGNOSIS — O2610 Low weight gain in pregnancy, unspecified trimester: Secondary | ICD-10-CM | POA: Insufficient documentation

## 2012-07-18 DIAGNOSIS — Z22330 Carrier of Group B streptococcus: Secondary | ICD-10-CM

## 2012-07-18 DIAGNOSIS — O99810 Abnormal glucose complicating pregnancy: Secondary | ICD-10-CM

## 2012-07-18 LAB — POCT URINALYSIS DIP (DEVICE)
Glucose, UA: NEGATIVE mg/dL
Hgb urine dipstick: NEGATIVE
Nitrite: NEGATIVE
Specific Gravity, Urine: 1.02 (ref 1.005–1.030)
pH: 7 (ref 5.0–8.0)

## 2012-07-18 NOTE — Progress Notes (Signed)
Pulse: 82

## 2012-07-18 NOTE — Progress Notes (Deleted)
Fasting 70-80's,  2 hour PC 70-120 (150 x 1). Active baby. Growth [place] 7/22 5-4 (22%). AFI 18 cm. F/U growth [place] recommended, but would coincide w/ planned IOL. Watch fundal height closely. Strips and lancets given.

## 2012-07-18 NOTE — Patient Instructions (Signed)
Estreptococo del grupo B durante el embarazo  (Group B Strep During Pregnancy) El estreptococo del grupo B es un tipo de bacteria que se encuentra en mujeres sanas. Generalmente no causa sntomas ni daos a las mujeres adultas sanas, pero la bacteria puede enfermar gravemente al beb si se lo transmite durante el parto. Esta no es una enfermedad de transmisin sexual. Es diferente del estreptococo del grupo A, la bacteria que causa la "angina estreptocccica".  CAUSAS El estreptococo del grupo B puede encontrase en el tracto intestinal, reproductor y urinario de las mujeres. Tambin se encuentra en el tracto genital femenino, generalmente en la vagina y el recto.  SNTOMAS Durante el embarazo, el GBS puede localizarse en diferentes zonas:  Tracto genital sin sntomas.   Recto sin sntomas.   Orina con o sin sntomas (bacteriuria asintomtica).   Los sntomas pueden incluir dolor, frecuencia, [place] y sangre al Geographical information systems officer (cistitis).  Las mujeres embarazadas infectadas con el estreptococo del grupo B tienen ms riesgo de:   Trabajo de parto y parto anticipado (prematuro).   Ruptura prolongada de [place].   Infecciones en los siguientes rganos:   Vejiga.   Riones (pielonefritis).   Bolsa de aguas o la placenta (corioamnionitis).   tero (endometritis) despus del parto.  Los recin nacidos infectados pueden presentar:   Infeccin en el pulmn (neumona).   Infeccin en la sangre (septicemia).   Infeccin de las membranas que cubren el cerebro y la mdula espinal (meningitis).  DIAGNSTICO El diagnstico se realiza con [employer] 35 y las 37 semanas del [place]. La prueba (cultivo) consiste en pasar un hisopo por la vagina y el recto. Tambin le tomarn una muestra de orina para controlar si hay bacterias. Hable con su mdico para planificar el parto, si los [employer] que es portadora de la bacteria estreptococo del grupo B.  TRATAMIENTO Si los [place] de los  cultivos son positivos y Medical sales representative que hay estreptococo del grupo B, le indicarn un tratamiento con antibiticos durante el [place] de [place]. Esto evitar que la bacteria pase al beb. Los antibiticos funcionan slo si se administran durante el [place] de [place]. Si el tratamiento se realiza a comienzos del Psychiatrist, la bacteria puede volver a Engineer, building services y Theme park manager presente durante el [place] de [place]. Informe a su mdico si es alrgica a la penicilina o a otros antibiticos. Tambin se indican antibiticos si:   Se sospecha una infeccin en las membranas (amnionitis).   El [employer] de parto o la ruptura de la bolsa comienza antes de las 37 semanas de gestacin y existe un gran riesgo de dar a luz al beb.   Existe un historial de haber dado a luz un nio con infeccin de GBS.   El estado del cultivo es desconocido (no se ha realizado o no est disponible) y existe:   Teacher, English as a foreign language durante el parto.   Trabajo de parto antes de trmino (menos de 37 semanas de gestacin).   Ruptura prolongada de las membranas (18 horas o ms).  El tratamiento de la madre durante el trabajo de parto no se recomienda si:  Se ha planeado Neomia Dear cesrea y no hay trabajo de parto ni ruptura de [place]. Esto se realiza incluso si la madre se ha realizado pruebas de GBS que dieron positivo.   El cultivo de GBS ha dado negativo durante el [place], ms all de los factores de riesgo durante el [place].  El beb recibir antibiticos si sus anlisis han sido positivos para el estafilococo del [place]  B. No se recomienda administrar antibiticos de rutina a los bebs cuyas madres recibieron tratamiento con antibiticos durante el trabajo de [place].  INSTRUCCIONES PARA EL CUIDADO DOMICILIARIO  Tome los antibiticos que le indic el profesional que lo asiste.   Tome slo la medicacin que le indic el profesional.   Contine con las visitas y cuidados prenatales.   Vuelva para las visitas de control y para Artist resultado de  los cultivos.   Siga las indicaciones del mdico.  SOLICITE ATENCIN MDICA SI:  Siente dolor al Geographical information systems officer.   Tiene necesidad frecuente de Geographical information systems officer.   Observa sangre en la orina.  SOLICITE ATENCIN MDICA DE INMEDIATO SI:  Tiene fiebre.   Dolor de espalda, en un lado o en el tero.   Escalofros.   Inflamacin abdominal (distensin) o dolor.   Tiene contracciones cada 10 minutos o con ms frecuencia.   Pierde lquido o sangre por la vagina.   Siente una presin en la pelvis como si el beb empujara hacia abajo.   Siente un dolor en la espalda leve y sordo.   Tiene clicos que se sienten como si tuviera el perodo.   Tiene clicos abdominales con o sin diarrea.   Ha vomitado repetidas veces y [place].   Tiene dificultad para respirar.   Se siente confundida.   Siente rigidez en el cuerpo o en el cuello.  EST SEGURO QUE:   Comprende las instrucciones para el alta mdica.   Controlar su enfermedad.   Solicitar atencin mdica de inmediato segn las indicaciones.  Document Released: 05/25/2008 Document Revised: 11/26/2011 [facility] Patient Information 2012 [place], [place]. Mtodo para contar los movimientos fetales (Fetal Movement Counts) Nombre de la paciente: __________________________________________________ Franco Nones probable de parto:____________________ En los embarazos de alto riesgo se recomienda contar las pataditas, pero tambin es una buena idea que lo hagan todas las [place]. Comience a contarlas a las 28 semanas de embarazo. Los movimientos fetales aumentan luego de una comida Immunologist o de comer o beber algo dulce (el nivel de azcar en la sangre est ms alto). Tambin es importante beber gran cantidad de lquidos (hidratarse bien) antes de contar. Si se recuesta sobre el lado izquierdo mejorar la Designer, industrial/product, o puede sentarse en una silla cmoda con los brazos sobre el abdomen y sin distracciones que la rodeen. CONTANDO  Trate de contar a la [employer] lo haga.   Marque el da y la hora y vea cunto le lleva sentir 10 movimientos (patadas, agitaciones, sacudones, vueltas). Debe sentir al menos 10 movimientos en 2 horas. Probablemente sienta los 10 movimientos en menos de dos horas. Si no los siente, espere una hora y cuente nuevamente. Luego de [employer] tendr un patrn.   Debemos observar si hay cambios en el patrn o no hay suficientes pataditas en 2 horas. Le lleva ms tiempo contar los 10 movimientos?  SOLICITE ATENCIN MDICA SI:  Siente menos de 10 pataditas en 2 horas. Intntelo dos veces.   No siente movimientos durante 1 hora.   El patrn se modifica o le lleva ms tiempo Art gallery manager las 10 pataditas.   Siente que el beb no se mueve como lo hace habitualmente.  Fecha: ____________ Movimientos: ____________ Comienzo hora: ____________ [name]: ____________ Franco Nones: ____________ Movimientos: ____________ Comienzo hora: ____________ [name]: ____________ Franco Nones: ____________ Movimientos: ____________ Comienzo hora: ____________ [name]: ____________ Franco Nones: ____________ Movimientos: ____________ Comienzo hora: ____________ [name]: ____________ Franco Nones: ____________ Movimientos: ____________ Comienzo hora: ____________ [name]:  ____________ Franco Nones: ____________ Movimientos: ____________ Comienzo hora: ____________ [name]: ____________ Franco Nones: ____________ Movimientos: ____________ Comienzo hora: ____________ [name]: ____________  Franco Nones: ____________ Movimientos: ____________ Comienzo hora: ____________ [name]: ____________ Franco Nones: ____________ Movimientos: ____________ Comienzo hora: ____________ [name]: ____________ Franco Nones: ____________ Movimientos: ____________ Comienzo hora: ____________ [name]: ____________ Franco Nones: ____________ Movimientos: ____________ Comienzo hora: ____________ [name]: ____________ Franco Nones: ____________ Movimientos: ____________ Comienzo hora: ____________ [name]:  ____________ Franco Nones: ____________ Movimientos: ____________ Comienzo hora: ____________ [name]: ____________ Franco Nones: ____________ Movimientos: ____________ Comienzo hora: ____________ [name]: ____________  Franco Nones: ____________ Movimientos: ____________ Comienzo hora: ____________ [name]: ____________ Franco Nones: ____________ Movimientos: ____________ Comienzo hora: ____________ [name]: ____________ Franco Nones: ____________ Movimientos: ____________ Comienzo hora: ____________ [name]: ____________ Franco Nones: ____________ Movimientos: ____________ Comienzo hora: ____________ [name]: ____________ Franco Nones: ____________ Movimientos: ____________ Comienzo hora: ____________ [name]: ____________ Franco Nones: ____________ Movimientos: ____________ Comienzo hora: ____________ [name]: ____________ Franco Nones: ____________ Movimientos: ____________ Comienzo hora: ____________ [name]: ____________  Franco Nones: ____________ Movimientos: ____________ Comienzo hora: ____________ [name]: ____________ Franco Nones: ____________ Movimientos: ____________ Comienzo hora: ____________ [name]: ____________ Franco Nones: ____________ Movimientos: ____________ Comienzo hora: ____________ [name]: ____________ Franco Nones: ____________ Movimientos: ____________ Comienzo hora: ____________ [name]: ____________ Franco Nones: ____________ Movimientos: ____________ Comienzo hora: ____________ [name]: ____________ Franco Nones: ____________ Movimientos: ____________ Comienzo hora: ____________ [name]: ____________ Franco Nones: ____________ Movimientos: ____________ Comienzo hora: ____________ [name]: ____________  Franco Nones: ____________ Movimientos: ____________ Comienzo hora: ____________ [name]: ____________ Franco Nones: ____________ Movimientos: ____________ Comienzo hora: ____________ [name]: ____________ Franco Nones: ____________ Movimientos: ____________ Comienzo hora: ____________ [name]: ____________ Franco Nones: ____________ Movimientos: ____________ Comienzo hora:  ____________ [name]: ____________ Franco Nones: ____________ Movimientos: ____________ Comienzo hora: ____________ [name]: ____________ Franco Nones: ____________ Movimientos: ____________ Comienzo hora: ____________ [name]: ____________ Franco Nones: ____________ Movimientos: ____________ Comienzo hora: ____________ [name]: ____________  Franco Nones: ____________ Movimientos: ____________ Comienzo hora: ____________ [name]: ____________ Franco Nones: ____________ Movimientos: ____________ Comienzo hora: ____________ [name]: ____________ Franco Nones: ____________ Movimientos: ____________ Comienzo hora: ____________ [name]: ____________ Franco Nones: ____________ Movimientos: ____________ Comienzo hora: ____________ [name]: ____________ Franco Nones: ____________ Movimientos: ____________ Comienzo hora: ____________ [name]: ____________ Franco Nones: ____________ Movimientos: ____________ Comienzo hora: ____________ [name]: ____________ Franco Nones: ____________ Movimientos: ____________ Comienzo hora: ____________ [name]: ____________  Franco Nones: ____________ Movimientos: ____________ Comienzo hora: ____________ [name]: ____________ Franco Nones: ____________ Movimientos: ____________ Comienzo hora: ____________ [name]: ____________ Franco Nones: ____________ Movimientos: ____________ Comienzo hora: ____________ [name]: ____________ Franco Nones: ____________ Movimientos: ____________ Comienzo hora: ____________ [name]: ____________ Franco Nones: ____________ Movimientos: ____________ Comienzo hora: ____________ [name]: ____________ Franco Nones: ____________ Movimientos: ____________ Comienzo hora: ____________ [name]: ____________ Franco Nones: ____________ Movimientos: ____________ Comienzo hora: ____________ [name]: ____________  Franco Nones: ____________ Movimientos: ____________ Comienzo hora: ____________ [name]: ____________ Franco Nones: ____________ Movimientos: ____________ Comienzo hora: ____________ [name]: ____________ Franco Nones: ____________ Movimientos: ____________  Comienzo hora: ____________ [name]: ____________ Franco Nones: ____________ Movimientos: ____________ Comienzo hora: ____________ [name]: ____________ Franco Nones: ____________ Movimientos: ____________ Comienzo hora: ____________ [name]: ____________ Franco Nones: ____________ Movimientos: ____________ Comienzo hora: ____________ [name]: ____________ Franco Nones: ____________ Movimientos: ____________ Comienzo hora: ____________ Fin hora: ____________  Document Released: 03/15/2008 Document Revised: 11/26/2011 [employer] Patient Information 2012 [place] [place].  Ihor Dow y parto normal (Normal Labor and Delivery) Licensed conveyancer, su mdico debe estar seguro de que usted est en trabajo de [place]. Algunos signos son:  Puede haber eliminado el "tapn mucoso" antes que comience el trabajo de [place]. Se trata de una pequea cantidad de mucus con sangre.   Tiene contracciones uterinas regulares.   El [employer]  contracciones se acorta.   Las molestias y Chief Technology Officer se hacen gradualmente ms intensos.   El dolor se ubica principalmente en la espalda.   Los dolores empeoran al [employer].   El cuello del tero (la apertura del tero se hace ms delgada, comienza a borrarse, y se abre (se dilata).  Una vez que se encuentre en [place] parto y sea admitida en el hospital, el mdico har lo siguiente:  Un examen fsico completo.   Controlar sus signos vitales (presin arterial, pulso, temperatura y la frecuencia cardaca fetal).   Realizar un examen vaginal (usando un guante estril y lubricante para determinar:   La posicin (presentacin) del beb (ceflica [vertex] o nalgas primero).   El nivel (plano) de la cabeza del beb en el canal de parto.   El borramiento y dilatacin del cuello del tero.   Le rasurarn el vello pbico y le aplicarn una enema segn lo considere el mdico y las circunstancias.   Generalmente se coloca un monitor electrnico sobre el abdomen. El monitor sigue la  duracin e intensidad de las contracciones, as como la frecuencia cardaca del beb.   Generalmente, el profesional inserta una va intravenosa en el brazo para administrarle agua azucarada. Esta es una medida de precaucin, de modo que puedan administrarle rpidamente medicamentos durante el [place] de [place].  EL TRABAJO DE PARTO Y PARTO NORMALES SE DIVIDEN EN 3 ETAPAS: Primera etapa Comienzan las contracciones regulares y el cuello comienza a borrarse y dilatarse. Esta etapa puede durar entre 3 y 15 horas. El final de la primera etapa se considera cuando el cuello est borrado en un 100% y se ha dilatado 10 cm. Le administrarn analgsicos por:  Inyeccin (morfina, demerol, etc.).   Anestesia regional (espinal, caudal o epidural, anestsicos colocados en diferentes regiones de la columna vertebral). Podrn administrarle medicamentos para el dolor en la regin paracervical, que consiste en la aplicacin de un anestsico inyectable en cada uno de los lados del cuello del tero.  La embarazada puede requerir un "parto natural" , es decir no recibir [employer] o anestesia durante el [place] de parto y [place]. Segunda etapa En este momento el beb baja a travs del canal de parto (vagina) y nace. Esto puede durar entre 1 y 4 horas. A medida que el beb asoma la cabeza por el canal de parto, podr sentir una sensacin similar a cuando mueve el intestino. Sentir el impulse de empujar con fuerza hasta que el nio salga. A medida que la cabecita baja, el mdico decidir si realiza una episiotoma (corte en el perineo y rea de la vagina) para evitar la ruptura de los tejidos). Luego del nacimiento del beb y la expulsin de la placenta, la episiotoma se sutura. En algunos casos se coloca a la madre una mscara con xido nitroso para Research officer, political party respiracin y Engineer, materials. El final de la etapa 2 se produce cuando el beb ha salido completamente. Luego, cuando el cordn umbilical deja de pulsar, se  pinza y se corta. Tercera etapa La tercera etapa comienza luego que el beb ha nacido y finaliza luego de la expulsin de la placenta. Generalmente esto lleva entre 5 y 30 minutos. Luego de la expulsin de la placenta, le aplicarn un medicamento por va intravenosa para ayudar a Engineer, materials y Psychiatric nurse. En la tercera etapa no hay dolor y generalmente no son necesarios los analgsicos. Si le han realizado una episiotoma, es el momento de Sales promotion account executive. Luego del [place], la  mam es observada y controlada exhaustivamente durante 1  2 horas para verificar que no hay sangrado en el post parto (hemorragias). Si pierde [employer], le administrarn un medicamento para Engineer, manufacturing tero y Comptroller. Document Released: 11/19/2008 Document Revised: 11/26/2011 [facility] Patient Information 2012 [place], [place].

## 2012-07-18 NOTE — Progress Notes (Signed)
Fasting 72-85 2 hour PC 78-121 Active baby. Reviewed growth [place] from 7/22. EFW 5-4, 22%, AFI 18.03 67%. Wt loss, but reports normal appetite. F/U Growth [place]  In 2 weeks w/ IOL at 40 weeks. Strips and lancets given.

## 2012-07-19 LAB — GC/CHLAMYDIA PROBE AMP, URINE: GC Probe Amp, Urine: NEGATIVE

## 2012-07-25 ENCOUNTER — Ambulatory Visit (INDEPENDENT_AMBULATORY_CARE_PROVIDER_SITE_OTHER): Payer: Self-pay | Admitting: Physician Assistant

## 2012-07-25 VITALS — BP 106/66 | Temp 97.1°F | Wt 149.3 lb

## 2012-07-25 DIAGNOSIS — O99810 Abnormal glucose complicating pregnancy: Secondary | ICD-10-CM

## 2012-07-25 DIAGNOSIS — O24419 Gestational diabetes mellitus in pregnancy, unspecified control: Secondary | ICD-10-CM

## 2012-07-25 LAB — POCT URINALYSIS DIP (DEVICE)
Glucose, UA: NEGATIVE mg/dL
Hgb urine dipstick: NEGATIVE
Ketones, ur: NEGATIVE mg/dL
Specific Gravity, Urine: 1.02 (ref 1.005–1.030)

## 2012-07-25 NOTE — Patient Instructions (Signed)
Embarazo - Tercer trimestre (Pregnancy - Third Trimester) El tercer trimestre del embarazo (los ltimos 3 meses) es el perodo de cambios ms rpidos que atraviesan usted y el beb. El aumento de peso es ms rpido. El beb alcanza un largo de aproximadamente 50 cm (20 pulgadas) y pesa entre 2,700 y 4,500 kg (6 a 10 libras). El beb gana ms tejido graso y ya est listo para la vida fuera del cuerpo de la madre. Mientras estn en el interior, los bebs tienen perodos de sueo y vigilia, succionan el pulgar y tienen hipo. Quizs sienta pequeas contracciones del tero. Este es el falso trabajo de parto. Tambin se las conoce como contracciones de Braxton-Hicks. Es como una prctica del parto. Los problemas ms habituales de esta etapa del embarazo incluyen mayor dificultad para respirar, hinchazn de las manos y los pies por retencin de lquidos y la necesidad de orinar con ms frecuencia debido a que el tero y el beb presionan sobre la vejiga.  EXAMENES PRENATALES  Durante los exmenes prenatales, deber seguir realizando pruebas de sangre, segn avance el embarazo. Estas pruebas se realizan para controlar su salud y la del beb. Tambin se realizan anlisis de sangre para conocer los niveles de hemoglobina. La anemia (bajo nivel de hemoglobina) es frecuente durante el embarazo. Para prevenirla, se administran hierro y vitaminas. Tambin le harn nuevas pruebas para descartar la diabetes. Podrn repetirle algunas de las pruebas que le hicieron previamente.   En cada visita le medirn el tamao del tero. Es para asegurarse de que el beb se desarrolla correctamente.   Tambin en cada visita la pesarn. Esto se realiza para asegurarse de que aumenta de peso al ritmo indicado y que usted y su beb evolucionan normalmente.   En algunas ocasiones se realiza una ecografa para confirmar el correcto desarrollo y evolucin del beb. Esta prueba se realiza con ondas sonoras inofensivas para el beb, de modo  que el profesional pueda calcular con ms precisin la fecha del parto.   Discuta las posibilidades de la anestesia si necesita cesrea.  Algunas veces se realizan pruebas especializadas del lquido amnitico que rodea al beb. Esta prueba se denomina amniocentesis. El lquido amnitico se obtiene introduciendo una aguja en el abdomen (vientre). En ocasiones se lleva a cabo cerca del final del embarazo, si es necesario adelantar el parto. En este caso se realiza para asegurarse de que los pulmones del beb estn lo suficientemente maduros como para que pueda vivir fuera del tero. CAMBIOS QUE OCURREN EN EL TERCER TRIMESTRE DEL EMBARAZO Su organismo atravesar diferentes cambios durante el embarazo que varan de una persona a otra. [place] con el profesional que la asiste acerca los cambios que usted note y que la preocupen.  Durante el ltimo trimestre probablemente sienta un aumento del apetito. Es normal tener "antojos" de ciertas comidas. Esto vara de una persona a otra y de un embarazo a otro.   Podrn aparecer las primeras estras en las caderas, abdomen y mamas. Estos son cambios normales del cuerpo durante el embarazo. No existen medicamentos ni ejercicios que puedan prevenir estos cambios.   El estreimiento puede tratarse con un laxante o agregando fibra a su dieta. Beber grandes cantidades de lquidos, tomar fibras en forma de verduras, frutas y granos integrales es de gran ayuda.   Tambin es beneficioso practicar actividad fsica. Si ha sido una persona activa hasta el embarazo, podr continuar con la mayora de las actividades durante el mismo. Si ha sido menos activa, puede ser beneficioso   que comience con un programa de ejercicios, como Scientist, clinical (histocompatibility and immunogenetics). Consulte con el profesional que la asiste antes de comenzar un programa de ejercicios.   Evite el consumo de cigarrillos, el alcohol, los medicamentos no prescritos y las "drogas de la calle" durante el [place]. Estas sustancias  qumicas afectan la formacin y el desarrollo del beb. Evite estas sustancias durante todo el embarazo para asegurar el nacimiento de un beb sano.   Dolor de espalda, venas varicosas y hemorroides podran aparecer o empeorar.   Los movimientos del beb pueden ser ms bruscos y aparecer ms a menudo.   Puede que note dificultades para respirar facilmente.   El ombligo podra salrsele hacia afuera.   Puede segregar un lquido amarillento (calostro) de las [place].   Puede segregar mucus con sangre. Esto normalmente ocurre unos [address] a una semana antes de que comience el [place] de [place].  INSTRUCCIONES PARA EL CUIDADO DOMICILIARIO La mayor parte de los cuidados que se aconsejan son los mismos que los indicados para las primeras etapas del Psychiatrist. Es importante que concurra a todas las citas con el profesional y siga sus instrucciones con Camera operator a los medicamentos que deba Chemical engineer, a la actividad fsica y a Psychologist, forensic.  Induccin del [place] de parto  (Labor Induction) La mayora de las mujeres comienza el trabajo de parto sin [place] las 37 y 42 semanas del Psychiatrist. Cuando esto no sucede, o cuando hay una necesidad mdica, pueden utilizarse medicamentos u otros mtodos para inducirlo. La induccin al parto hace que el tero de la mujer embarazada se [place]. Tambin hace que el cuello uterino se ablande (madure), se abra (se dilate), y se afine (se borre). Por lo general, el trabajo de parto no es inducido antes de las 39 semanas del embarazo excepto que haya un problema con el beb o de la [place].  Que en su caso sea inducido depende de ciertos factores, por ejemplo:  Su estado de salud y el de su beb.  Cuntas semanas tiene de [place].  El [place] de madurez de los pulmones del beb.  El estado del cuello uterino.  La posicin del beb.  MOTIVOS PARA INDUCIR EL TRABAJO DE PARTO  La salud del beb o de la madre estn en riesgo.  El embarazo se ha pasado 1 semana o ms.  Se  ha roto la bolsa de [place], pero el parto no empieza por s mismo.  La madre tiene un problema de salud o una enfermedad grave, como hipertensin arterial, infeccin, desprendimiento de la placenta o diabetes.  Hay poca cantidad de lquido amnitico alrededor del beb.  El beb tiene sufrimiento fetal.  MOTIVOS PARA NO INDUCIR EL TRABAJO DE PARTO  La induccin del parto puede no ser Neomia Dear buena idea si:  Se observa que su beb no tolerar el trabajo de [place].  La induccin es slo ms conveniente.  Usted quiere que el beb nazca en una fecha determinada, como un da de [place].  Ha tenido cirugas previas en el tero, como una miomectoma o la extraccin de fibromas.  La placenta se encuentra muy baja en el tero y obstruye la abertura del cuello del tero (placenta previa).  El beb no est en posicin de cabeza.  El cordn umbilical cae por el canal del parto, frente al beb. En este caso podra cortarse el suministro de sangre de oxgeno al beb.  Usted ha tenido antes un parto por cesrea.  Hay circunstancias inusuales, como que el beb sea [address]  prematuro.  RIESGOS Y COMPLICACIONES  Pueden ocurrir problemas en el proceso de induccin y podr ser necesario modificar los planes segn desarrollo de los acontecimientos. Algunos de los riesgos de la induccin son:  Cambio en la frecuencia cardaca fetal, tales como que sea muy alta, muy baja, o errtica.  Riesgo de sufrimiento fetal.  Riesgo de infeccin para la madre y el beb.  Aumento de la posibilidad de tener un parto por cesrea.  Posibilidad rara, pero aumentada de que la placenta se separe del tero (desprendimiento).  Rotura uterina (muy raro).  Cuando la induccin es necesaria por razones mdicas, los beneficios de la induccin pueden ser [employer].  ANTES DEL PROCEDIMIENTO  El mdico controlar el cuello y la posicin del beb. Esto lo ayudar a decidir si est en condiciones de una induccin al [employer].  PROCEDIMIENTO  Se  pueden utilizar varios mtodos de induccin del [place], como:  Tomar medicamentos como prostaglandinas para dilatar y Customer service manager el cuello del tero. Este medicamento tambin iniciar las contracciones. Podr ser administrado por va oral o mediante la insercin de un supositorio en la vagina.  Podrn insertarle un tubo delgado (catter) con un globo en el extremo en la vagina para dilatar el cuello del tero. Una vez insertado, el globo se expande con agua, lo que hace que el cuello uterino se abra.  Ruptura de las [place]. El mdico inserta un dedo entre el cuello del tero y las [place], lo que hace que el cuello del tero se estire y podr hacer que el tero se Technical sales engineer. Esto se suele hacer durante una visita al [facility]. Se la enviar a su casa a esperar a que las contracciones comiencen. Luego vendr para la induccin.  Ruptura de la bolsa de aguas. Su mdico har un Audiological scientist amnitico con un instrumento pequeo. Una vez que se rompe el saco amnitico, las Therapist, occupational. Pueden pasar algunas horas para ver su efecto.  Tomar medicamentos para desencadenar o reforzar las contracciones. Este medicamento se administra por va intravenosa a travs de un tubo en el brazo.  Todos los mtodos de induccin, adems la rotura de [place], se Futures trader hospital. La induccin se [place], de modo que usted y el beb sean atentamente controlados.  DESPUS DEL PROCEDIMIENTO  Algunas inducciones pueden llevar hasta 2 o 3 das. Dependiendo del cuello del tero, por lo general toma menos tiempo. Se tarda ms tiempo cuando se induce al inicio del Psychiatrist o si es Contractor. Si la madre est todava embarazada y han realizado la induccin durante 2 a 3 das, ser [employer] sea [place] a su casa o que tenga un parto por Copy.  Document Released: 03/15/2008 Document Revised: 11/26/2011  [facility] Patient Information 2012 [place], [place].  Durante el  embarazo debe obtener nutrientes para usted y para su beb. Consuma alimentos balanceados a intervalos regulares. Elija alimentos como carne, pescado, [place] y otros productos lcteos descremados, verduras, frutas, panes integrales y cereales. El Equities trader cul es el aumento de peso ideal.   Las relaciones sexuales pueden continuarse hasta casi el final del embarazo, si no se presentan otros problemas como prdida prematura (antes de tiempo) de lquido amnitico, hemorragia vaginal o dolor abdominal (en el vientre).   Realice [employer], si no tiene restricciones. Consulte con el profesional que la asiste si no sabe con certeza si determinados ejercicios son seguros. El mayor aumento de [place] se  produce en los dos ltimos trimestres del embarazo.   Haga reposo con frecuencia, con las piernas elevadas, o segn lo necesite para evitar los calambres y el dolor de cintura.   Use un buen sostn o como los que se usan para hacer deportes para Paramedic la sensibilidad de las [place]. Tambin puede serle til si lo [place] mientras duerme. Si pierde Product manager, podr [employer].   No utilice la baera con agua caliente, baos turcos y saunas.   Colquese el cinturn de seguridad cuando conduzca. Este la proteger a usted y al beb en caso de accidente.   Evite comer carne cruda y el contacto con los utensilios y desperdicios de los gatos. Estos elementos contienen grmenes que pueden causar defectos de nacimiento en el beb.   Es fcil perder algo de orina durante el [place]. Apretar y Chief Operating Officer los msculos de la pelvis la ayudar con este problema. Practique detener la miccin cuando est en el bao. Estos son los mismos msculos que Development worker, international aid. Son [employer] mismos msculos que utiliza cuando trata de [employer] gases. Puede practicar apretando estos msculos [employer], y repetir esto tres veces por da aproximadamente. Una vez que conozca qu  msculos debe contraer, no realice estos ejercicios durante la miccin. Puede favorecerle una infeccin si la orina vuelve hacia atrs.   Pida ayuda si tiene necesidades econmicas, de asesoramiento o nutricionales durante el [place]. El profesional podr ayudarla con respecto a estas necesidades, o derivarla a otros especialistas.   Practique la ida [employer] [facility] a modo de [place].   Tome clases prenatales junto con su pareja para comprender, practicar y hacer preguntas acerca del Aleen Campi de parto y el nacimiento.   Prepare la habitacin del beb.   No viaje fuera de la ciudad a menos que sea absolutamente necesario y con el consejo del mdico.   Use slo zapatos bajos sin taco para tener un mejor equilibrio y prevenir cadas.  EL CONSUMO DE MEDICAMENTOS Y DROGAS DURANTE EL EMBARAZO  Contine tomando las vitaminas apropiadas para esta etapa tal como se le indic. Las vitaminas deben contener un miligramo de cido flico y deben suplementarse con hierro. Guarde todas las vitaminas fuera del alcance de los nios. La ingestin de slo un par de vitaminas o comprimidos que contengan hierro pueden ocasionar la [employer] en un beb o en un nio pequeo.   Evite el uso de [place], inclusive los de venta E. [place], que no hayan sido prescritos o indicados por el profesional que la asiste. Algunos medicamentos pueden causar problemas fsicos al beb. Utilice los medicamentos de venta libre o de prescripcin para Chief Technology Officer, Environmental health practitioner o la [place], segn se lo indique el profesional que lo asiste. No utilice aspirina, ibuprofeno (Motrin, Advil, Nuprin) o naproxeno (Aleve) a menos que el profesional la autorice.   El alcohol se asocia a cierto nmero de defectos del nacimiento, incluido el sndrome de alcoholismo fetal. Debe evitar el consumo de alcohol en cualquiera de sus formas. El cigarrillo causa nacimientos prematuros y bebs de bajo peso al nacer. Las drogas de la calle son muy nocivas para el  beb y estn absolutamente prohibidas. Un beb que nace de [employer], ser adicto al nacer. Ese beb tendr los mismos sntomas de abstinencia que un adulto.   Infrmele al profesional si consume alguna droga.  SOLICITE ATENCIN MDICA SI: Tiene alguna preocupacin Academic librarian. Es mejor que llame para formular las preguntas si no puede esperar [employer]  la prxima visita, que sentirse preocupada por ellas.  DECISIONES ACERCA DE LA CIRCUNCISIN Usted puede saber o no cul es el sexo de su beb. Si es un varn, ste es el momento de pensar acerca de la circuncisin. La circuncisin es la extirpacin del prepucio. Esta es la piel que cubre el extremo sensible del pene. No hay un motivo mdico que lo justifique. Generalmente la decisin se toma segn lo que sea popular en ese momento, o se basa en creencias religiosas. Podr conversar estos temas con el profesional que la asiste. SOLICITE ATENCIN MDICA DE INMEDIATO SI:  La temperatura oral se eleva sin motivo por encima de 102 F (38.9 C) o segn le indique el profesional que la asiste.   Tiene una prdida de lquido por la vagina (canal de parto). Si sospecha una ruptura de las [place], tmese la temperatura y llame al profesional para informarlo sobre esto.   Observa unas pequeas manchas, una hemorragia vaginal o elimina cogulos. Avsele al profesional acerca de la cantidad y de cuntos apsitos est utilizando.   Presenta un olor desagradable en la secrecin vaginal y observa un cambio en el color, de transparente a blanco.   Ha vomitado durante ms de 24 horas.   Presenta escalofros o fiebre.   Comienza a sentir falta de aire.   Siente ardor al Beatrix Shipper.   [place] o sube ms de 900 g (ms de 2 libras), o segn lo indicado por el profesional que la asiste. Observa que sbitamente se le hinchan el rostro, las manos, los pies o las piernas.   Presenta dolor abdominal. Las molestias en el ligamento redondo son Neomia Dear causa benigna  (no cancerosa) frecuente de Engineer, mining abdominal durante el Psychiatrist, pero el profesional que la asiste deber evaluarlo.   Presenta dolor de cabeza intenso que no se [place].   Si no siente los movimientos del beb durante ms de tres horas. Si piensa que el beb no se mueve tanto como lo haca habitualmente, coma algo que Psychologist, clinical y Target Corporation lado izquierdo durante [place]. El beb debe moverse al menos 4  5 veces por hora. Comunquese inmediatamente si el beb se mueve menos que lo indicado.   Se cae, se ve involucrada en un accidente automovilstico o sufre algn tipo de traumatismo.   En su hogar hay violencia mental o fsica.  Document Released: 09/16/2005 Document Revised: 11/26/2011 [facility] Patient Information 2012 [place], [place].

## 2012-07-25 NOTE — Progress Notes (Signed)
P=88 

## 2012-07-25 NOTE — Progress Notes (Signed)
All BS great! FBS all < 90, 2 PP < 121 (single 142). Continue routine care for diet control. Reviewed +GBS will tx in labor. IOL at 40 weeks, if no labor before. Check cervix at next visit. Growth at 36 week 5#4 (22%), AFI 18cm.

## 2012-08-01 ENCOUNTER — Ambulatory Visit (INDEPENDENT_AMBULATORY_CARE_PROVIDER_SITE_OTHER): Payer: Self-pay | Admitting: Advanced Practice Midwife

## 2012-08-01 VITALS — BP 116/65 | Temp 96.6°F | Wt 148.4 lb

## 2012-08-01 DIAGNOSIS — O2610 Low weight gain in pregnancy, unspecified trimester: Secondary | ICD-10-CM

## 2012-08-01 DIAGNOSIS — O26899 Other specified pregnancy related conditions, unspecified trimester: Secondary | ICD-10-CM

## 2012-08-01 DIAGNOSIS — O9989 Other specified diseases and conditions complicating pregnancy, childbirth and the puerperium: Secondary | ICD-10-CM

## 2012-08-01 DIAGNOSIS — O99810 Abnormal glucose complicating pregnancy: Secondary | ICD-10-CM

## 2012-08-01 LAB — POCT URINALYSIS DIP (DEVICE)
Glucose, UA: NEGATIVE mg/dL
Hgb urine dipstick: NEGATIVE
Ketones, ur: NEGATIVE mg/dL
Nitrite: NEGATIVE
Specific Gravity, Urine: 1.025 (ref 1.005–1.030)
pH: 7 (ref 5.0–8.0)

## 2012-08-01 NOTE — Progress Notes (Signed)
Pulse 70 

## 2012-08-01 NOTE — Patient Instructions (Signed)
Mtodo para contar los movimientos fetales (Fetal Movement Counts) Nombre de la paciente: __________________________________________________ Joan Poole probable de parto:____________________ En los embarazos de alto riesgo se recomienda contar las pataditas, pero tambin es una buena idea que lo hagan todas las [place]. Comience a contarlas a las 28 semanas de embarazo. Los movimientos fetales aumentan luego de una comida Immunologist o de comer o beber algo dulce (el nivel de azcar en la sangre est ms alto). Tambin es importante beber gran cantidad de lquidos (hidratarse bien) antes de contar. Si se recuesta sobre el lado izquierdo mejorar la Designer, industrial/product, o puede sentarse en una silla cmoda con los brazos sobre el abdomen y sin distracciones que la rodeen. CONTANDO  Trate de contar a la [employer] lo haga.   Marque el da y la hora y vea cunto le lleva sentir 10 movimientos (patadas, agitaciones, sacudones, vueltas). Debe sentir al menos 10 movimientos en 2 horas. Probablemente sienta los 10 movimientos en menos de dos horas. Si no los siente, espere una hora y cuente nuevamente. Luego de [employer] tendr un patrn.   Debemos observar si hay cambios en el patrn o no hay suficientes pataditas en 2 horas. Le lleva ms tiempo contar los 10 movimientos?  SOLICITE ATENCIN MDICA SI:  Siente menos de 10 pataditas en 2 horas. Intntelo dos veces.   No siente movimientos durante 1 hora.   El patrn se modifica o le lleva ms tiempo Art gallery manager las 10 pataditas.   Siente que el beb no se mueve como lo hace habitualmente.  Fecha: ____________ Movimientos: ____________ Comienzo hora: ____________ Joan Poole: ____________ Joan Poole: ____________ Movimientos: ____________ Comienzo hora: ____________ Joan Poole: ____________ Joan Poole: ____________ Movimientos: ____________ Comienzo hora: ____________ Joan Poole: ____________ Joan Poole: ____________ Movimientos: ____________ Comienzo hora:  ____________ Joan Poole: ____________ Joan Poole: ____________ Movimientos: ____________ Comienzo hora: ____________ Joan Poole: ____________ Joan Poole: ____________ Movimientos: ____________ Comienzo hora: ____________ Joan Poole: ____________ Joan Poole: ____________ Movimientos: ____________ Comienzo hora: ____________ Joan Poole: ____________  Joan Poole: ____________ Movimientos: ____________ Comienzo hora: ____________ Joan Poole: ____________ Joan Poole: ____________ Movimientos: ____________ Comienzo hora: ____________ Joan Poole: ____________ Joan Poole: ____________ Movimientos: ____________ Comienzo hora: ____________ Joan Poole: ____________ Joan Poole: ____________ Movimientos: ____________ Comienzo hora: ____________ Joan Poole: ____________ Joan Poole: ____________ Movimientos: ____________ Comienzo hora: ____________ Joan Poole: ____________ Joan Poole: ____________ Movimientos: ____________ Comienzo hora: ____________ Joan Poole: ____________ Joan Poole: ____________ Movimientos: ____________ Comienzo hora: ____________ Joan Poole: ____________  Joan Poole: ____________ Movimientos: ____________ Comienzo hora: ____________ Joan Poole: ____________ Joan Poole: ____________ Movimientos: ____________ Comienzo hora: ____________ Joan Poole: ____________ Joan Poole: ____________ Movimientos: ____________ Comienzo hora: ____________ Joan Poole: ____________ Joan Poole: ____________ Movimientos: ____________ Comienzo hora: ____________ Joan Poole: ____________ Joan Poole: ____________ Movimientos: ____________ Comienzo hora: ____________ Joan Poole: ____________ Joan Poole: ____________ Movimientos: ____________ Comienzo hora: ____________ Joan Poole: ____________ Joan Poole: ____________ Movimientos: ____________ Comienzo hora: ____________ Joan Poole: ____________  Joan Poole: ____________ Movimientos: ____________ Comienzo hora: ____________ Joan Poole: ____________ Joan Poole: ____________ Movimientos: ____________ Comienzo hora: ____________ Joan Poole: ____________ Joan Poole: ____________ Movimientos: ____________  Comienzo hora: ____________ Joan Poole: ____________ Joan Poole: ____________ Movimientos: ____________ Comienzo hora: ____________ Joan Poole: ____________ Joan Poole: ____________ Movimientos: ____________ Comienzo hora: ____________ Joan Poole: ____________ Joan Poole: ____________ Movimientos: ____________ Comienzo hora: ____________ Joan Poole: ____________ Joan Poole: ____________ Movimientos: ____________ Comienzo hora: ____________ Joan Poole: ____________  Joan Poole: ____________ Movimientos: ____________ Comienzo hora: ____________ Joan Poole: ____________ Joan Poole: ____________ Movimientos: ____________ Comienzo hora: ____________ Joan Poole: ____________ Joan Poole: ____________ Movimientos: ____________ Comienzo hora: ____________ Joan Poole: ____________ Joan Poole: ____________ Movimientos: ____________ Comienzo hora: ____________ Joan Poole: ____________ Joan Poole: ____________ Movimientos:  ____________ Comienzo hora: ____________ Joan Poole: ____________ Joan Poole: ____________ Movimientos: ____________ Comienzo hora: ____________ Joan Poole: ____________ Joan Poole: ____________ Movimientos: ____________ Comienzo hora: ____________ Joan Poole: ____________  Joan Poole: ____________ Movimientos: ____________ Comienzo hora: ____________ Joan Poole: ____________ Joan Poole: ____________ Movimientos: ____________ Comienzo hora: ____________ Joan Poole: ____________ Joan Poole: ____________ Movimientos: ____________ Comienzo hora: ____________ Joan Poole: ____________ Joan Poole: ____________ Movimientos: ____________ Comienzo hora: ____________ Joan Poole: ____________ Joan Poole: ____________ Movimientos: ____________ Comienzo hora: ____________ Joan Poole: ____________ Joan Poole: ____________ Movimientos: ____________ Comienzo hora: ____________ Joan Poole: ____________ Joan Poole: ____________ Movimientos: ____________ Comienzo hora: ____________ Joan Poole: ____________  Joan Poole: ____________ Movimientos: ____________ Comienzo hora: ____________ Joan Poole: ____________ Joan Poole: ____________ Movimientos:  ____________ Comienzo hora: ____________ Joan Poole: ____________ Joan Poole: ____________ Movimientos: ____________ Comienzo hora: ____________ Joan Poole: ____________ Joan Poole: ____________ Movimientos: ____________ Comienzo hora: ____________ Joan Poole: ____________ Joan Poole: ____________ Movimientos: ____________ Comienzo hora: ____________ Joan Poole: ____________ Joan Poole: ____________ Movimientos: ____________ Comienzo hora: ____________ Joan Poole: ____________ Joan Poole: ____________ Movimientos: ____________ Comienzo hora: ____________ Joan Poole: ____________  Joan Poole: ____________ Movimientos: ____________ Comienzo hora: ____________ Joan Poole: ____________ Joan Poole: ____________ Movimientos: ____________ Comienzo hora: ____________ Joan Poole: ____________ Joan Poole: ____________ Movimientos: ____________ Comienzo hora: ____________ Joan Poole: ____________ Joan Poole: ____________ Movimientos: ____________ Comienzo hora: ____________ Joan Poole: ____________ Joan Poole: ____________ Movimientos: ____________ Comienzo hora: ____________ Joan Poole: ____________ Joan Poole: ____________ Movimientos: ____________ Comienzo hora: ____________ Joan Poole: ____________ Joan Poole: ____________ Movimientos: ____________ Comienzo hora: ____________ Fin hora: ____________  Document Released: 03/15/2008 Document Revised: 11/26/2011 ExitCare Patient Information 2012 [place], [place].  Ihor Dow y parto normal (Normal Labor and Delivery) Licensed conveyancer, su mdico debe estar seguro de que usted est en trabajo de [place]. Algunos signos son:  Puede haber eliminado el "tapn mucoso" antes que comience el trabajo de [place]. Se trata de una pequea cantidad de mucus con sangre.   Tiene contracciones uterinas regulares.   El [employer] las contracciones se acorta.   Las molestias y Chief Technology Officer se hacen gradualmente ms intensos.   El dolor se ubica principalmente en la espalda.   Los dolores empeoran al [employer].   El cuello del tero (la apertura del  tero se hace ms delgada, comienza a borrarse, y se abre (se dilata).  Una vez que se encuentre en [place] parto y sea admitida en el hospital, el mdico har lo siguiente:  Un examen fsico completo.   Controlar sus signos vitales (presin arterial, pulso, temperatura y la frecuencia cardaca fetal).   Realizar un examen vaginal (usando un guante estril y lubricante para determinar:   La posicin (presentacin) del beb (ceflica [vertex] o nalgas primero).   El nivel (plano) de la cabeza del beb en el canal de parto.   El borramiento y dilatacin del cuello del tero.   Le rasurarn el vello pbico y le aplicarn una enema segn lo considere el mdico y las circunstancias.   Generalmente se coloca un monitor electrnico sobre el abdomen. El monitor sigue la duracin e intensidad de las contracciones, as como la frecuencia cardaca del beb.   Generalmente, el profesional inserta una va intravenosa en el brazo para administrarle agua azucarada. Esta es una medida de precaucin, de modo que puedan administrarle rpidamente medicamentos durante el [place] de [place].  EL TRABAJO DE PARTO Y PARTO NORMALES SE DIVIDEN EN 3 ETAPAS: Primera etapa Comienzan las contracciones regulares y el cuello comienza a borrarse y dilatarse. Esta etapa puede durar entre 3 y 15 horas. El final de la primera etapa se considera cuando el cuello est borrado en un  100% y se ha dilatado 10 cm. Le administrarn analgsicos por:  Inyeccin (morfina, demerol, etc.).   Anestesia regional (espinal, caudal o epidural, anestsicos colocados en diferentes regiones de la columna vertebral). Podrn administrarle medicamentos para el dolor en la regin paracervical, que consiste en la aplicacin de un anestsico inyectable en cada uno de los lados del cuello del tero.  La embarazada puede requerir un "parto natural" , es decir no recibir [employer] o anestesia durante el [place] de parto y [place]. Segunda  etapa En este momento el beb baja a travs del canal de parto (vagina) y nace. Esto puede durar entre 1 y 4 horas. A medida que el beb asoma la cabeza por el canal de parto, podr sentir una sensacin similar a cuando mueve el intestino. Sentir el impulse de empujar con fuerza hasta que el nio salga. A medida que la cabecita baja, el mdico decidir si realiza una episiotoma (corte en el perineo y rea de la vagina) para evitar la ruptura de los tejidos). Luego del nacimiento del beb y la expulsin de la placenta, la episiotoma se sutura. En algunos casos se coloca a la madre una mscara con xido nitroso para Research officer, political party respiracin y Engineer, materials. El final de la etapa 2 se produce cuando el beb ha salido completamente. Luego, cuando el cordn umbilical deja de pulsar, se pinza y se corta. Tercera etapa La tercera etapa comienza luego que el beb ha nacido y finaliza luego de la expulsin de la placenta. Generalmente esto lleva entre 5 y 30 minutos. Luego de la expulsin de la placenta, le aplicarn un medicamento por va intravenosa para ayudar a Engineer, materials y Psychiatric nurse. En la tercera etapa no hay dolor y generalmente no son necesarios los analgsicos. Si le han realizado una episiotoma, es el momento de Sales promotion account executive. Luego del parto, la mam es observada y controlada exhaustivamente durante 1  2 horas para verificar que no hay sangrado en el post parto (hemorragias). Si pierde [employer], le administrarn un medicamento para Engineer, manufacturing tero y Comptroller. Document Released: 11/19/2008 Document Revised: 11/26/2011 [facility] Patient Information 2012 [place], [place].

## 2012-08-01 NOTE — Progress Notes (Signed)
Fasting 81-93, 2 hour PC< 121  Except 130, 140. Membranes swept. Growth [place] scheduled due to fundal height S<D, poor weight gain and borderline SGA per [place] 3 weeks ago.

## 2012-08-01 NOTE — Progress Notes (Signed)
U/S scheduled August 02, 2012 at 8 am.

## 2012-08-02 ENCOUNTER — Inpatient Hospital Stay (HOSPITAL_COMMUNITY): Payer: Medicaid Other | Admitting: Anesthesiology

## 2012-08-02 ENCOUNTER — Inpatient Hospital Stay (HOSPITAL_COMMUNITY)
Admission: AD | Admit: 2012-08-02 | Discharge: 2012-08-04 | DRG: 774 | Disposition: A | Discharge status: Home / Self Care | Payer: Medicaid Other | Source: Ambulatory Visit | Attending: Obstetrics & Gynecology | Admitting: Obstetrics & Gynecology

## 2012-08-02 ENCOUNTER — Ambulatory Visit (HOSPITAL_COMMUNITY)
Admission: RE | Admit: 2012-08-02 | Discharge: 2012-08-02 | Disposition: A | Discharge status: Home / Self Care | Payer: Medicaid Other | Source: Ambulatory Visit | Attending: Advanced Practice Midwife | Admitting: Advanced Practice Midwife

## 2012-08-02 ENCOUNTER — Encounter (HOSPITAL_COMMUNITY): Payer: Self-pay | Admitting: Anesthesiology

## 2012-08-02 ENCOUNTER — Encounter (HOSPITAL_COMMUNITY): Payer: Self-pay | Admitting: *Deleted

## 2012-08-02 DIAGNOSIS — O4290 Premature rupture of membranes, unspecified as to length of time between rupture and onset of labor, unspecified weeks of gestation: Secondary | ICD-10-CM

## 2012-08-02 DIAGNOSIS — O365990 Maternal care for other known or suspected poor fetal growth, unspecified trimester, not applicable or unspecified: Secondary | ICD-10-CM | POA: Insufficient documentation

## 2012-08-02 DIAGNOSIS — O99814 Abnormal glucose complicating childbirth: Secondary | ICD-10-CM

## 2012-08-02 DIAGNOSIS — R509 Fever, unspecified: Secondary | ICD-10-CM

## 2012-08-02 DIAGNOSIS — O99810 Abnormal glucose complicating pregnancy: Secondary | ICD-10-CM

## 2012-08-02 DIAGNOSIS — O2610 Low weight gain in pregnancy, unspecified trimester: Secondary | ICD-10-CM

## 2012-08-02 DIAGNOSIS — O09529 Supervision of elderly multigravida, unspecified trimester: Secondary | ICD-10-CM | POA: Insufficient documentation

## 2012-08-02 DIAGNOSIS — O0930 Supervision of pregnancy with insufficient antenatal care, unspecified trimester: Secondary | ICD-10-CM | POA: Insufficient documentation

## 2012-08-02 LAB — RUBELLA SCREEN: Rubella: 292.4 IU/mL

## 2012-08-02 LAB — BASIC METABOLIC PANEL
BUN: 7 mg/dL (ref 6–23)
Chloride: 103 mEq/L (ref 96–112)
GFR calc Af Amer: >90 mL/min (ref >90)
Glucose, Bld: 105 mg/dL — ABNORMAL HIGH (ref 70–99)
Potassium: 2.9 mEq/L — ABNORMAL LOW (ref 3.5–5.1)
Sodium: 135 mEq/L (ref 135–145)

## 2012-08-02 LAB — CBC WITH DIFFERENTIAL/PLATELET
Basophils Absolute: 0 10*3/uL (ref 0.0–0.1)
Eosinophils Relative: 0 % (ref 0–5)
HCT: 32.6 % — ABNORMAL LOW (ref 36.0–46.0)
Lymphocytes Relative: 4 % — ABNORMAL LOW (ref 12–46)
Lymphs Abs: 0.5 10*3/uL — ABNORMAL LOW (ref 0.7–4.0)
MCV: 94.8 fL (ref 78.0–100.0)
Monocytes Absolute: 0.4 10*3/uL (ref 0.1–1.0)
RDW: 14.1 % (ref 11.5–15.5)
WBC: 12.3 10*3/uL — ABNORMAL HIGH (ref 4.0–10.5)

## 2012-08-02 LAB — URINALYSIS, ROUTINE W REFLEX MICROSCOPIC
Bilirubin Urine: NEGATIVE
Glucose, UA: NEGATIVE mg/dL
Protein, ur: NEGATIVE mg/dL

## 2012-08-02 LAB — CBC
HCT: 34.3 % — ABNORMAL LOW (ref 36.0–46.0)
Hemoglobin: 11.8 g/dL — ABNORMAL LOW (ref 12.0–15.0)
MCHC: 34.4 g/dL (ref 30.0–36.0)
RDW: 14 % (ref 11.5–15.5)
WBC: 11.8 10*3/uL — ABNORMAL HIGH (ref 4.0–10.5)

## 2012-08-02 LAB — ABO/RH: ABO/RH(D): A POS

## 2012-08-02 LAB — URINE MICROSCOPIC-ADD ON

## 2012-08-02 LAB — TYPE AND SCREEN: Antibody Screen: NEGATIVE

## 2012-08-02 MED ORDER — IBUPROFEN 600 MG PO TABS
600.0000 mg | ORAL_TABLET | Freq: Four times a day (QID) | ORAL | Status: DC
  Administered 2012-08-03 – 2012-08-04 (×7): 600 mg via ORAL
  Filled 2012-08-02 (×7): qty 1

## 2012-08-02 MED ORDER — DIBUCAINE 1 % RE OINT: 1.0000 "application " | TOPICAL_OINTMENT | RECTAL | Status: DC | PRN

## 2012-08-02 MED ORDER — ACETAMINOPHEN 325 MG PO TABS: 650.0000 mg | ORAL_TABLET | ORAL | Status: DC | PRN

## 2012-08-02 MED ORDER — FENTANYL 2.5 MCG/ML BUPIVACAINE 1/10 % EPIDURAL INFUSION (WH - ANES)
INTRAMUSCULAR | Status: DC | PRN
  Administered 2012-08-02: 14 mL/h via EPIDURAL

## 2012-08-02 MED ORDER — TETANUS-DIPHTH-ACELL PERTUSSIS 5-2.5-18.5 LF-MCG/0.5 IM SUSP: 0.5000 mL | Freq: Once | INTRAMUSCULAR | Status: DC

## 2012-08-02 MED ORDER — CITRIC ACID-SODIUM CITRATE 334-500 MG/5ML PO SOLN: 30.0000 mL | ORAL | Status: DC | PRN

## 2012-08-02 MED ORDER — LACTATED RINGERS IV SOLN: 500.0000 mL | INTRAVENOUS | Status: DC | PRN

## 2012-08-02 MED ORDER — DIPHENHYDRAMINE HCL 25 MG PO CAPS: 25.0000 mg | ORAL_CAPSULE | Freq: Four times a day (QID) | ORAL | Status: DC | PRN

## 2012-08-02 MED ORDER — LACTATED RINGERS IV BOLUS (SEPSIS)
1000.0000 mL | Freq: Once | INTRAVENOUS | Status: AC
  Administered 2012-08-02: 1000 mL via INTRAVENOUS

## 2012-08-02 MED ORDER — NALBUPHINE SYRINGE 5 MG/0.5 ML: 5.0000 mg | INJECTION | INTRAMUSCULAR | Status: DC | PRN

## 2012-08-02 MED ORDER — OXYTOCIN 40 UNITS IN LACTATED RINGERS INFUSION - SIMPLE MED
1.0000 m[IU]/min | INTRAVENOUS | Status: DC
  Administered 2012-08-02: 2 m[IU]/min via INTRAVENOUS
  Filled 2012-08-02: qty 1000

## 2012-08-02 MED ORDER — IBUPROFEN 600 MG PO TABS: 600.0000 mg | ORAL_TABLET | Freq: Four times a day (QID) | ORAL | Status: DC | PRN

## 2012-08-02 MED ORDER — PENICILLIN G POTASSIUM 5000000 UNITS IJ SOLR
5.0000 10*6.[IU] | Freq: Once | INTRAVENOUS | Status: AC
  Administered 2012-08-02: 5 10*6.[IU] via INTRAVENOUS
  Filled 2012-08-02: qty 5

## 2012-08-02 MED ORDER — OXYCODONE-ACETAMINOPHEN 5-325 MG PO TABS: 1.0000 | ORAL_TABLET | ORAL | Status: DC | PRN

## 2012-08-02 MED ORDER — PENICILLIN G POTASSIUM 5000000 UNITS IJ SOLR
2.5000 10*6.[IU] | INTRAVENOUS | Status: DC
  Administered 2012-08-02: 2.5 10*6.[IU] via INTRAVENOUS
  Filled 2012-08-02 (×3): qty 2.5

## 2012-08-02 MED ORDER — BENZOCAINE-MENTHOL 20-0.5 % EX AERO: 1.0000 "application " | INHALATION_SPRAY | CUTANEOUS | Status: DC | PRN

## 2012-08-02 MED ORDER — EPHEDRINE 5 MG/ML INJ
INTRAVENOUS | Status: AC
  Filled 2012-08-02: qty 4

## 2012-08-02 MED ORDER — PHENYLEPHRINE 40 MCG/ML (10ML) SYRINGE FOR IV PUSH (FOR BLOOD PRESSURE SUPPORT): 80.0000 ug | PREFILLED_SYRINGE | INTRAVENOUS | Status: DC | PRN

## 2012-08-02 MED ORDER — PRENATAL MULTIVITAMIN CH
1.0000 | ORAL_TABLET | Freq: Every day | ORAL | Status: DC
  Administered 2012-08-03 – 2012-08-04 (×2): 1 via ORAL
  Filled 2012-08-02 (×2): qty 1

## 2012-08-02 MED ORDER — ONDANSETRON HCL 4 MG PO TABS: 4.0000 mg | ORAL_TABLET | ORAL | Status: DC | PRN

## 2012-08-02 MED ORDER — PHENYLEPHRINE 40 MCG/ML (10ML) SYRINGE FOR IV PUSH (FOR BLOOD PRESSURE SUPPORT)
PREFILLED_SYRINGE | INTRAVENOUS | Status: AC
  Filled 2012-08-02: qty 5

## 2012-08-02 MED ORDER — FLEET ENEMA 7-19 GM/118ML RE ENEM: 1.0000 | ENEMA | RECTAL | Status: DC | PRN

## 2012-08-02 MED ORDER — ONDANSETRON HCL 4 MG/2ML IJ SOLN: 4.0000 mg | Freq: Four times a day (QID) | INTRAMUSCULAR | Status: DC | PRN

## 2012-08-02 MED ORDER — POTASSIUM CHLORIDE 20 MEQ/15ML (10%) PO LIQD
40.0000 meq | Freq: Every day | ORAL | Status: AC
  Administered 2012-08-02: 40 meq via ORAL
  Filled 2012-08-02 (×2): qty 30

## 2012-08-02 MED ORDER — EPHEDRINE 5 MG/ML INJ: 10.0000 mg | INTRAVENOUS | Status: DC | PRN

## 2012-08-02 MED ORDER — OXYTOCIN 40 UNITS IN LACTATED RINGERS INFUSION - SIMPLE MED: 62.5000 mL/h | Freq: Once | INTRAVENOUS | Status: DC

## 2012-08-02 MED ORDER — LACTATED RINGERS IV SOLN
INTRAVENOUS | Status: DC
  Administered 2012-08-02: 19:00:00 via INTRAVENOUS

## 2012-08-02 MED ORDER — LIDOCAINE HCL (PF) 1 % IJ SOLN
INTRAMUSCULAR | Status: DC | PRN
  Administered 2012-08-02 (×2): 5 mL

## 2012-08-02 MED ORDER — HYDROXYZINE HCL 50 MG/ML IM SOLN: 50.0000 mg | Freq: Four times a day (QID) | INTRAMUSCULAR | Status: DC | PRN

## 2012-08-02 MED ORDER — FENTANYL 2.5 MCG/ML BUPIVACAINE 1/10 % EPIDURAL INFUSION (WH - ANES)
INTRAMUSCULAR | Status: AC
  Filled 2012-08-02: qty 60

## 2012-08-02 MED ORDER — SIMETHICONE 80 MG PO CHEW: 80.0000 mg | CHEWABLE_TABLET | ORAL | Status: DC | PRN

## 2012-08-02 MED ORDER — TERBUTALINE SULFATE 1 MG/ML IJ SOLN: 0.2500 mg | Freq: Once | INTRAMUSCULAR | Status: DC | PRN

## 2012-08-02 MED ORDER — OXYTOCIN BOLUS FROM INFUSION
250.0000 mL | Freq: Once | INTRAVENOUS | Status: DC
  Filled 2012-08-02: qty 500

## 2012-08-02 MED ORDER — WITCH HAZEL-GLYCERIN EX PADS: 1.0000 "application " | MEDICATED_PAD | CUTANEOUS | Status: DC | PRN

## 2012-08-02 MED ORDER — ZOLPIDEM TARTRATE 5 MG PO TABS: 5.0000 mg | ORAL_TABLET | Freq: Every evening | ORAL | Status: DC | PRN

## 2012-08-02 MED ORDER — HYDROXYZINE HCL 50 MG PO TABS: 50.0000 mg | ORAL_TABLET | Freq: Four times a day (QID) | ORAL | Status: DC | PRN

## 2012-08-02 MED ORDER — ACETAMINOPHEN 325 MG PO TABS
650.0000 mg | ORAL_TABLET | Freq: Once | ORAL | Status: AC
  Administered 2012-08-02: 650 mg via ORAL
  Filled 2012-08-02: qty 2

## 2012-08-02 MED ORDER — DIPHENHYDRAMINE HCL 50 MG/ML IJ SOLN: 12.5000 mg | INTRAMUSCULAR | Status: DC | PRN

## 2012-08-02 MED ORDER — FENTANYL 2.5 MCG/ML BUPIVACAINE 1/10 % EPIDURAL INFUSION (WH - ANES): 14.0000 mL/h | INTRAMUSCULAR | Status: DC

## 2012-08-02 MED ORDER — LANOLIN HYDROUS EX OINT: TOPICAL_OINTMENT | CUTANEOUS | Status: DC | PRN

## 2012-08-02 MED ORDER — LACTATED RINGERS IV SOLN
500.0000 mL | Freq: Once | INTRAVENOUS | Status: AC
  Administered 2012-08-02: 500 mL via INTRAVENOUS

## 2012-08-02 MED ORDER — SENNOSIDES-DOCUSATE SODIUM 8.6-50 MG PO TABS
2.0000 | ORAL_TABLET | Freq: Every day | ORAL | Status: DC
  Administered 2012-08-03: 2 via ORAL

## 2012-08-02 MED ORDER — LIDOCAINE HCL (PF) 1 % IJ SOLN
30.0000 mL | INTRAMUSCULAR | Status: DC | PRN
  Filled 2012-08-02: qty 30

## 2012-08-02 MED ORDER — ONDANSETRON HCL 4 MG/2ML IJ SOLN: 4.0000 mg | INTRAMUSCULAR | Status: DC | PRN

## 2012-08-02 NOTE — Anesthesia Procedure Notes (Signed)
Epidural Patient location during procedure: OB Start time: 08/02/2012 7:18 PM  Staffing Anesthesiologist: Brayton Caves R Performed by: anesthesiologist   Preanesthetic Checklist Completed: patient identified, site marked, surgical consent, pre-op evaluation, timeout performed, IV checked, risks and benefits discussed and monitors and equipment checked  Epidural Patient position: sitting Prep: site prepped and draped and DuraPrep Patient monitoring: continuous pulse ox and blood pressure Approach: midline Injection technique: LOR air and LOR saline  Needle:  Needle type: Tuohy  Needle gauge: 17 G Needle length: 9 cm Needle insertion depth: 5 cm cm Catheter type: closed end flexible Catheter size: 19 Gauge Catheter at skin depth: 10 cm Test dose: negative  Assessment Events: blood not aspirated, injection not painful, no injection resistance, negative IV test and no paresthesia  Additional Notes Patient identified.  Risk benefits discussed including failed block, incomplete pain control, headache, nerve damage, paralysis, blood pressure changes, nausea, vomiting, reactions to medication both toxic or allergic, and postpartum back pain.  Patient expressed understanding and wished to proceed.  All questions were answered.  Sterile technique used throughout procedure and epidural site dressed with sterile barrier dressing. No paresthesia or other complications noted.The patient did not experience any signs of intravascular injection such as tinnitus or metallic taste in mouth nor signs of intrathecal spread such as rapid motor block. Please see nursing notes for vital signs.

## 2012-08-02 NOTE — Anesthesia Preprocedure Evaluation (Signed)
Anesthesia Evaluation  Patient identified by MRN, date of birth, ID band Patient awake    Reviewed: Allergy & Precautions, H&P , Patient's Chart, lab work & pertinent test results  Airway Mallampati: II TM Distance: >3 FB Neck ROM: full    Dental No notable dental hx.    Pulmonary neg pulmonary ROS,  breath sounds clear to auscultation  Pulmonary exam normal       Cardiovascular negative cardio ROS  Rhythm:regular Rate:Normal     Neuro/Psych negative neurological ROS  negative psych ROS   GI/Hepatic negative GI ROS, Neg liver ROS,   Endo/Other  negative endocrine ROSGestational  Renal/GU negative Renal ROS     Musculoskeletal   Abdominal   Peds  Hematology negative hematology ROS (+)   Anesthesia Other Findings   Reproductive/Obstetrics (+) Pregnancy                           Anesthesia Physical Anesthesia Plan  ASA: III  Anesthesia Plan: Epidural   Post-op Pain Management:    Induction:   Airway Management Planned:   Additional Equipment:   Intra-op Plan:   Post-operative Plan:   Informed Consent: I have reviewed the patients History and Physical, chart, labs and discussed the procedure including the risks, benefits and alternatives for the proposed anesthesia with the patient or authorized representative who has indicated his/her understanding and acceptance.     Plan Discussed with:   Anesthesia Plan Comments:         Anesthesia Quick Evaluation

## 2012-08-02 NOTE — Progress Notes (Signed)
Patient ID: Joan Poole, female   DOB: 08/04/1971, 41 y.o.   MRN: [id number]  Pt is 41 y.o. G4P3003 at [redacted]w[redacted]d who was sent to MAU from ultrasound for fever (temp 100.4) and fetal tachycardia. Pt interviewed with interpreter. She states she started feeling contractions at about 4 AM as well as fever and chills. Contractions are mild. She denies vag bleeding, loss of fluid. Baby moving normally. She denies dysuria, vaginal discharge, cough, congestion, or other symptoms associated with fever. Last office visit yesterday. Per record, pt was 3.5 cm dilated.  She is a gestational diabetic who is diet controlled. She had no problems with her prior pregnancies or deliveries.  Exam: Temp on admission to MAU 100.4. BP 106/74, pulse 115.  Alert and oriented. No acute distress.  Heart rate - tachycardic, regular Normal respiratory effort, no cough. Abdomen:  Appropriate size for dates, non-tender, soft. No edema  Cervix by digital exam:  3.5/50/-3, soft FHTs:  165, mod var, + accels, two late decels early in strip [place]oco:  Irregular ctx:  > 10 minutes apart  A/P:  IV fluids, tylenol, UA, CBC with diff.

## 2012-08-02 NOTE — Progress Notes (Addendum)
Joan Poole is a 41 y.o. [phone] at [redacted]w[redacted]d by LMP admitted for fever of unknown origin and variables.   Subjective: Denies pain or leaking  Objective: BP 106/74  Pulse 115  Temp 98.2 F (36.8 C) (Oral)  Resp 18  LMP 11/03/2011     FHT:  FHR: 135 bpm, variability: moderate,  accelerations:  Present,  decelerations:  Absent UC:   irregular, mild SVE:   Dilation: 3.5 Effacement (%): 50 Station: -3 Exam by:: Ivonne Andrew CNM  Labs: Lab Results  Component Value Date   WBC 12.3* 08/02/2012   HGB 11.2* 08/02/2012   HCT 32.6* 08/02/2012   MCV 94.8 08/02/2012   PLT 117* 08/02/2012   Results for orders placed during the hospital encounter of 08/02/12 (from the past 24 hour(s))  CBC WITH DIFFERENTIAL     Status: Abnormal   Collection Time   08/02/12 11:45 AM      Component Value Range   WBC 12.3 (*) 4.0 - 10.5 K/uL   RBC 3.44 (*) 3.87 - 5.11 MIL/uL   Hemoglobin 11.2 (*) 12.0 - 15.0 g/dL   HCT 08.6 (*) 57.8 - 46.9 %   MCV 94.8  78.0 - 100.0 fL   MCH 32.6  26.0 - 34.0 pg   MCHC 34.4  30.0 - 36.0 g/dL   RDW 62.9  52.8 - 41.3 %   Platelets 117 (*) 150 - 400 K/uL   Neutrophils Relative 93 (*) 43 - 77 %   Neutro Abs 11.4 (*) 1.7 - 7.7 K/uL   Lymphocytes Relative 4 (*) 12 - 46 %   Lymphs Abs 0.5 (*) 0.7 - 4.0 K/uL   Monocytes Relative 3  3 - 12 %   Monocytes Absolute 0.4  0.1 - 1.0 K/uL   Eosinophils Relative 0  0 - 5 %   Eosinophils Absolute 0.0  0.0 - 0.7 K/uL   Basophils Relative 0  0 - 1 %   Basophils Absolute 0.0  0.0 - 0.1 K/uL  AMNISURE RUPTURE OF MEMBRANE (ROM)     Status: Normal   Collection Time   08/02/12 12:30 PM      Component Value Range   Amnisure ROM POSITIVE    URINALYSIS, ROUTINE W REFLEX MICROSCOPIC     Status: Abnormal   Collection Time   08/02/12 12:35 PM      Component Value Range   Color, Urine YELLOW  YELLOW   APPearance CLEAR  CLEAR   Specific Gravity, Urine <1.005 (*) 1.005 - 1.030   pH 6.5  5.0 - 8.0   Glucose, UA NEGATIVE  NEGATIVE mg/dL   Hgb  urine dipstick TRACE (*) NEGATIVE   Bilirubin Urine NEGATIVE  NEGATIVE   Ketones, ur >80 (*) NEGATIVE mg/dL   Protein, ur NEGATIVE  NEGATIVE mg/dL   Urobilinogen, UA 0.2  0.0 - 1.0 mg/dL   Nitrite NEGATIVE  NEGATIVE   Leukocytes, UA TRACE (*) NEGATIVE  URINE MICROSCOPIC-ADD ON     Status: Abnormal   Collection Time   08/02/12 12:35 PM      Component Value Range   Squamous Epithelial / LPF MANY (*) RARE   WBC, UA 0-2  <3 WBC/hpf   RBC / HPF 0-2  <3 RBC/hpf   Assessment / Plan: PROM possibly since yesterday w/ membranes swept.   Labor: Will start pitocin. Preeclampsia:  NA Fetal Wellbeing:  Category I Pain Control:  Labor support without medications I/D:  No fever since arrival. PCN for GBS. No additional  ABX for possible chorio at this time per consult w/ Dr. Erin Fulling. Anticipated MOD:  NSVD  [place] Ke 08/02/2012, 1:51 PM

## 2012-08-02 NOTE — Progress Notes (Signed)
[facility] Department of Clinical Social Work Documentation of Interpretation   I assisted __Donna RN_________________ with interpretation of ____questions__________________ for this patient. 

## 2012-08-02 NOTE — H&P (Signed)
Attestation of Attending Supervision of Advanced Practitioner (CNM/NP): Evaluation and management procedures were performed by the Advanced Practitioner under my supervision and collaboration.  I have reviewed the Advanced Practitioner's note and chart, and I agree with the management and plan.  HARRAWAY-SMITH, Kaylanie Capili 1:52 PM     

## 2012-08-02 NOTE — H&P (Signed)
Joan Poole is a 41 y.o. female at [redacted] weeks gestation by LMP, verified by 25 week [place] sent to MAU from MFM for fever and subjectively low AFI. Pt receives care at [facility] for A1 GDM.  Maternal Medical History:  Reason for admission: Reason for Admission:   nausea  OB History    Grav Para Term Preterm Abortions TAB SAB Ect Mult Living   4 3 3       3      Past Medical History  Diagnosis Date  . Gestational diabetes    No past surgical history on file. Family History: family history is not on file. Social History:  reports that she has never smoked. She has never used smokeless tobacco. She reports that she does not drink alcohol or use illicit drugs.   Prenatal Transfer Tool  Maternal Diabetes: Yes:  Diabetes Type:  Diet controlled Genetic Screening: Declined Maternal Ultrasounds/Referrals: Abnormal:  Findings:   Other: Please see prenatal record for details Fetal Ultrasounds or other Referrals:  None Maternal Substance Abuse:  No Significant Maternal Medications:  None Significant Maternal Lab Results:  Lab values include: Group B Strep positive Other Comments:  None  Review of Systems  Constitutional: Positive for fever and chills. Negative for malaise/fatigue.  HENT: Negative for ear pain, congestion, sore throat, neck pain and ear discharge.   Eyes: Negative for blurred vision.  Respiratory: Negative for cough, sputum production, shortness of breath and wheezing.   Cardiovascular: Negative for chest pain and leg swelling.  Gastrointestinal: Negative for nausea, vomiting, abdominal pain and diarrhea.  Genitourinary: Negative for dysuria, urgency, frequency, hematuria and flank pain.  Musculoskeletal: Negative for myalgias.  Neurological: Negative for headaches.    Dilation: 3.5 Effacement (%): 50 Station: -3 Exam by:: VKatrinka Blazing CNM Blood pressure 106/74, pulse 115, temperature 98.2 F (36.8 C), temperature source Oral, resp. rate 18, last menstrual period  11/03/2011. Maternal Exam:  Uterine Assessment: Contraction strength is mild.  Contraction frequency is rare.   Abdomen: Estimated fetal weight is 6-7.   Fetal presentation: vertex  Introitus: Vulva is positive for vulvar varicosities (vs cyst. Painless.). Vagina is positive for vaginal discharge (blood-tinged mucous).  Ferning test: negative.   Pelvis: adequate for delivery.   Cervix: Cervix evaluated by sterile speculum exam and digital exam.   BBOW  Fetal Exam Fetal Monitor Review: Mode: ultrasound.   Baseline rate: 160-180.  Variability: moderate (6-25 bpm).   Pattern: accelerations present and variable decelerations.    Fetal State Assessment: Category II - tracings are indeterminate.     Physical Exam  Constitutional: She is oriented to person, place, and time. She appears well-developed and well-nourished. No distress.  HENT:  Head: Normocephalic.  Eyes: Conjunctivae are normal.  Cardiovascular: Tachycardia present.   Respiratory: Effort normal. No respiratory distress. She has no wheezes.  GI: Soft. Bowel sounds are normal. There is no tenderness.  Genitourinary: Vaginal discharge (blood-tinged mucous) found.  Musculoskeletal: She exhibits no edema and no tenderness.  Neurological: She is alert and oriented to person, place, and time.  Skin: Skin is warm and dry.    Prenatal labs: ABO, Rh: A/Positive/-- (05/06 0000) Antibody: Negative (05/06 0000) Rubella:  Pending RPR: Nonreactive (05/06 0000)  HBsAg:   Pending HIV: Non-reactive (05/06 0000)  GBS:   Pos Failed 3 hour GTT, GDM Amnisure and CBC w/ dif: pending  Assessment/Plan: 1. Abnormal maternal glucose tolerance, antepartum   2. Fever, unknown origin   3.  Term multip w/ favorable cervix  Plan: Admit to L&D for IOL for Dr. Erin Fulling. Pitocin.  Dorathy Kinsman 08/02/2012, 12:43 PM

## 2012-08-02 NOTE — MAU Note (Signed)
Hospital translator in for triage. Patient states she had an ultrasound today for weight and was sent to MAU for tachycardia. Patient is having contractions with some bloody show. Reports good fetal movement.

## 2012-08-03 LAB — CBC
MCV: 96.3 fL (ref 78.0–100.0)
Platelets: 131 10*3/uL — ABNORMAL LOW (ref 150–400)
RBC: 3.22 MIL/uL — ABNORMAL LOW (ref 3.87–5.11)
RDW: 14.3 % (ref 11.5–15.5)
WBC: 8.7 10*3/uL (ref 4.0–10.5)

## 2012-08-03 LAB — BASIC METABOLIC PANEL
CO2: 20 mEq/L (ref 19–32)
Chloride: 105 mEq/L (ref 96–112)
Creatinine, Ser: 0.58 mg/dL (ref 0.50–1.10)
GFR calc Af Amer: >90 mL/min (ref >90)
Potassium: 3.9 mEq/L (ref 3.5–5.1)
Sodium: 133 mEq/L — ABNORMAL LOW (ref 135–145)

## 2012-08-03 NOTE — Progress Notes (Signed)
Ur chart review completed.  

## 2012-08-03 NOTE — Anesthesia Postprocedure Evaluation (Signed)
  Anesthesia Post-op Note  Patient: Joan Poole  Procedure(s) Performed: * No procedures listed *  Patient Location: Mother/Baby  Anesthesia Type: Epidural  Level of Consciousness: awake, alert  and oriented  Airway and Oxygen Therapy: Patient Spontanous Breathing  Post-op Pain: none  Post-op Assessment: Post-op Vital signs reviewed and Patient's Cardiovascular Status Stable  Post-op Vital Signs: Reviewed and stable  Complications: No apparent anesthesia complications

## 2012-08-03 NOTE — Progress Notes (Signed)
Post Partum Day 1  Subjective: no complaints, voiding and tolerating PO  Objective: Blood pressure 105/59, pulse 87, temperature 98.7 F (37.1 C), temperature source Oral, resp. rate 18, height 5\' 2"  (1.575 m), weight 68.04 kg (150 lb), last menstrual period 11/03/2011, SpO2 97.00%, unknown if currently breastfeeding.  Physical Exam:  General: alert, cooperative and no distress Lochia: appropriate Uterine Fundus: firm DVT Evaluation: No evidence of DVT seen on physical exam.   Basename 08/03/12 0530 08/02/12 2112  HGB 10.6* 11.8*  HCT 31.0* 34.3*    Assessment/Plan: Plan for discharge tomorrow   LOS: 1 day   Interview and exam performed with interpreter. Mom is doing great, up and walking around, baby is breast feeding, plan for discharge tomorrow.  Aniceto Boss, MA, PA-S2  [facility], VICTORIA 08/03/2012, 7:31 AM   Seen also by me. Agree with note. Wynelle Bourgeois CNM

## 2012-08-04 MED ORDER — IBUPROFEN 600 MG PO TABS: 600.0000 mg | ORAL_TABLET | Freq: Four times a day (QID) | ORAL | Status: AC

## 2012-08-04 NOTE — Discharge Summary (Signed)
Obstetric Discharge Summary Reason for Admission: onset of labor Prenatal Procedures: none Intrapartum Procedures: spontaneous vaginal delivery Postpartum Procedures: none Complications-Operative and Postpartum: none Hemoglobin  Date Value Range Status  08/03/2012 10.6* 12.0 - 15.0 g/dL Final  01/25/3663 40.3   Final     HCT  Date Value Range Status  08/03/2012 31.0* 36.0 - 46.0 % Final  04/25/2012 35   Final    Physical Exam:  General: alert, cooperative and no distress Lochia: appropriate Uterine Fundus: firm DVT Evaluation: No evidence of DVT seen on physical exam.  Discharge Diagnoses: Term Pregnancy-delivered  Discharge Information: Date: 08/04/2012 Activity: pelvic rest Diet: routine Medications: None Condition: stable Instructions: refer to practice specific booklet Discharge to: home Follow-up Information    Follow up with [facility]. Schedule an appointment as soon as possible for a visit in 6 weeks.   Contact information:   [address] [place] [address]          Newborn Data: Live born female  Birth Weight: 5 lb 14.4 oz (2676 g) APGAR: 8, 9  Home with mother.  Pt is doing well, has a few concerns about breast feeding and would like to talk to lactation before going home. Pt unsure about birth control, plans to follow-up at clinic in 6 weeks.  Aniceto Boss, MA, PA-S2  [facility], Joan Poole 08/04/2012, 7:25 AM  I have seen and examined pt and agree with above as noted. Pt worried that she does not have sufficient breast milk yet and wants to have formula to take home. Will have lactation nurse discuss with pt.  Napoleon Form, MD

## 2012-08-08 ENCOUNTER — Encounter: Payer: Self-pay | Admitting: Obstetrics & Gynecology

## 2012-08-31 ENCOUNTER — Encounter: Payer: Self-pay | Admitting: Obstetrics and Gynecology

## 2012-08-31 ENCOUNTER — Ambulatory Visit (INDEPENDENT_AMBULATORY_CARE_PROVIDER_SITE_OTHER): Payer: Self-pay | Admitting: Advanced Practice Midwife

## 2012-08-31 NOTE — Progress Notes (Signed)
  Subjective:     Joan Poole is a 41 y.o. female who presents for a postpartum visit. She is 4 weeks postpartum following a spontaneous vaginal delivery. I have fully reviewed the prenatal and intrapartum course. The delivery was at 39 gestational weeks. Outcome: spontaneous vaginal delivery. Postpartum course has been normal. Baby's course has been normal. Baby is feeding by breast and bottle. Bleeding staining only and brown. Bowel function is normal. Bladder function is normal. Patient is not sexually active. 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 Postpartum depression screening: negative.  The following portions of the patient's history were reviewed and updated as appropriate: allergies, current medications, past family history, past medical history, past social history, past surgical history and problem list.  Review of Systems Pertinent items are noted in HPI.   Objective:    BP 135/66  Pulse 117  Temp 97.9 F (36.6 C)  Resp 20  Ht 5' (1.524 m)  Wt 62.415 kg (137 lb 9.6 oz)  BMI 26.87 kg/m2  Breastfeeding? Yes  General:  alert, cooperative and no distress   Breasts:  negative  Lungs: clear to auscultation bilaterally  Heart:  regular rate and rhythm, S1, S2 normal, no murmur, click, rub or gallop  Abdomen: soft, non-tender; bowel sounds normal; no masses,  no organomegaly   Vulva:  normal  Vagina: normal vagina, no discharge, exudate, lesion, or erythema  Cervix:  multiparous appearance  Corpus: normal size, contour, position, consistency, mobility, non-tender  Adnexa:  normal adnexa and no mass, fullness, tenderness  Rectal Exam: Normal rectovaginal exam        Assessment:    Normal postpartum exam. Pap smear not done at today's visit.   Plan:    1. Contraception: Natural Family Planning Discussed contraceptive choices with pt.  Pt reports she has planned each of her      4 pregnancies without difficulty. 2. Follow up in: 1 Year or as needed.

## 2014-10-22 ENCOUNTER — Encounter: Payer: Self-pay | Admitting: Obstetrics and Gynecology

## 2019-08-25 ENCOUNTER — Ambulatory Visit (HOSPITAL_COMMUNITY)
Admission: EM | Admit: 2019-08-25 | Discharge: 2019-08-25 | Disposition: A | Discharge status: Home / Self Care | Payer: Self-pay | Attending: Family Medicine | Admitting: Family Medicine

## 2019-08-25 ENCOUNTER — Encounter (HOSPITAL_COMMUNITY): Payer: Self-pay

## 2019-08-25 ENCOUNTER — Other Ambulatory Visit: Payer: Self-pay

## 2019-08-25 DIAGNOSIS — N939 Abnormal uterine and vaginal bleeding, unspecified: Secondary | ICD-10-CM

## 2019-08-25 LAB — POCT PREGNANCY, URINE: Preg Test, Ur: NEGATIVE

## 2019-08-25 NOTE — ED Triage Notes (Addendum)
Patient presents to Urgent Care with complaints of irregular periods/spotting since last month. Patient reports she wants to know why her periods have been irregular recently.  Patient not sure if she is beginning to enter menopause or if there is another problem. Patient has zero pain, would like local OBGYN recommendations that are accepting new patients.

## 2019-08-25 NOTE — Discharge Instructions (Signed)
Will notify you of any positive findings from your vaginal swab and if any changes to treatment are needed.   Please call to establish care with gynecology for recheck.  Any worsening of symptoms- increased heavy bleeding with pain, weakness, dizziness please return or go to the ER.

## 2019-08-25 NOTE — ED Provider Notes (Signed)
[facility]    CSN: [id number] Arrival date & time: 08/25/19  0840      History   Chief Complaint Chief Complaint  Patient presents with  . Appointment  . Vaginal Bleeding    HPI Joan Poole is a 48 y.o. female.   Joan Poole presents with family with complaints of abnormal uterine bleeding. States her LMP was 8/14, she started spotting again 8/26. Over the past two days the bleeding has increased from spotting. She is typically very regular with her periods. Blood has not been saturating a pad regularly, still small amount. No pelvic pain or cramping. No back pain. No gi symptoms. No new partners or concerns for STDs. Doesn't follow with gyne regularly, last pap was 7 years ago. She is not on any birth control or hormone therapy. 4 pregnancies and children. Denies dizziness, weakness. Without contributing medical history.    Daughter provides spanish interpretation per patient request.    ROS per HPI, negative if not otherwise mentioned.      Past Medical History:  Diagnosis Date  . Gestational diabetes     Patient Active Problem List   Diagnosis Date Noted  . Vaginal delivery 08/04/2012    Past Surgical History:  Procedure Laterality Date  . NO PAST SURGERIES      OB History    Gravida  4   Para  4   Term  4   Preterm      AB      Living  4     SAB      TAB      Ectopic      Multiple      Live Births  4            Home Medications    Prior to Admission medications   Medication Sig Start Date End Date Taking? Authorizing Provider  Prenatal Vit-Fe Fumarate-FA (MULTIVITAMIN-PRENATAL) 27-0.8 MG TABS Take 1 tablet by mouth daily. 05/23/12   Lesly DukesLeggett, Kelly H, MD    Family History Family History  Problem Relation Age of Onset  . Healthy Mother     Social History Social History   Tobacco Use  . Smoking status: Never Smoker  . Smokeless tobacco: Never Used  Substance Use Topics  . Alcohol use: No  .  Drug use: No     Allergies   Patient has no known allergies.   Review of Systems Review of Systems   Physical Exam Triage Vital Signs ED Triage Vitals  Enc Vitals Group     BP 08/25/19 0901 138/86     Pulse --      Resp 08/25/19 0901 15     Temp 08/25/19 0901 97.7 F (36.5 C)     Temp Source 08/25/19 0901 Oral     SpO2 08/25/19 0901 100 %     Weight --      Height --      Head Circumference --      Peak Flow --      Pain Score 08/25/19 0858 0     Pain Loc --      Pain Edu? --      Excl. in GC? --    No data found.  Updated Vital Signs BP 138/86 (BP Location: Left Arm)   Temp 97.7 F (36.5 C) (Oral)   Resp 15   SpO2 100%   Breastfeeding No Comment: possibly premenopause   Physical Exam Constitutional:  General: She is not in acute distress.    Appearance: She is well-developed.  Cardiovascular:     Rate and Rhythm: Normal rate.  Pulmonary:     Effort: Pulmonary effort is normal.  Abdominal:     Tenderness: There is no abdominal tenderness.  Genitourinary:    Uterus: Not enlarged and not tender.      Adnexa:        Right: No mass or tenderness.         Left: No mass or tenderness.       Comments: Right vaginal wall does appear somewhat edematous but not red or tender; dark red vaginal bleeding noted, unable to capture cervix within speculum for complete visual exam, does appear somewhat edematous but no obvious mass or lesions; on bimanual exam os is dilated at fingertip width; non tender  Skin:    General: Skin is warm and dry.  Neurological:     Mental Status: She is alert and oriented to person, place, and time.      UC Treatments / Results  Labs (all labs ordered are listed, but only abnormal results are displayed) Labs Reviewed  POC URINE PREG, ED  POCT PREGNANCY, URINE  CERVICOVAGINAL ANCILLARY ONLY    EKG   Radiology No results found.  Procedures Procedures (including critical care time)  Medications Ordered in UC  Medications - No data to display  Initial Impression / Assessment and Plan / UC Course  I have reviewed the triage vital signs and the nursing notes.  Pertinent labs & imaging results that were available during my care of the patient were reviewed by me and considered in my medical decision making (see chart for details).     Abnormal uterine bleeding in this 48 year old patient. No specific red flag findings on exam, os is open, but this patient has had 4 children. No pain, no fevers, weakness or dizziness. No heavy bleeding, BP WNL. Recommend establish with gyne for recheck and annual exam, workup as needed. Return precautions provided. Patient verbalized understanding and agreeable to plan.   Final Clinical Impressions(s) / UC Diagnoses   Final diagnoses:  Abnormal uterine bleeding (AUB)     Discharge Instructions     Will notify you of any positive findings from your vaginal swab and if any changes to treatment are needed.   Please call to establish care with gynecology for recheck.  Any worsening of symptoms- increased heavy bleeding with pain, weakness, dizziness please return or go to the ER.    ED Prescriptions    None     Controlled Substance Prescriptions [place] Controlled Substance Registry consulted? Not Applicable   Zigmund Gottron, NP 08/25/19 1038

## 2019-08-30 LAB — CERVICOVAGINAL ANCILLARY ONLY
Bacterial vaginitis: NEGATIVE
Candida vaginitis: NEGATIVE
Chlamydia: NEGATIVE
Neisseria Gonorrhea: NEGATIVE
Trichomonas: NEGATIVE

## 2019-09-18 ENCOUNTER — Other Ambulatory Visit (HOSPITAL_COMMUNITY)
Admission: RE | Admit: 2019-09-18 | Discharge: 2019-09-18 | Disposition: A | Discharge status: Home / Self Care | Payer: Self-pay | Source: Ambulatory Visit | Attending: Obstetrics and Gynecology | Admitting: Obstetrics and Gynecology

## 2019-09-18 ENCOUNTER — Other Ambulatory Visit: Payer: Self-pay

## 2019-09-18 ENCOUNTER — Ambulatory Visit (INDEPENDENT_AMBULATORY_CARE_PROVIDER_SITE_OTHER): Payer: Self-pay | Admitting: Obstetrics and Gynecology

## 2019-09-18 VITALS — Wt 152.6 lb

## 2019-09-18 DIAGNOSIS — N841 Polyp of cervix uteri: Secondary | ICD-10-CM

## 2019-09-18 DIAGNOSIS — Z124 Encounter for screening for malignant neoplasm of cervix: Secondary | ICD-10-CM

## 2019-09-18 DIAGNOSIS — N939 Abnormal uterine and vaginal bleeding, unspecified: Secondary | ICD-10-CM | POA: Insufficient documentation

## 2019-09-18 DIAGNOSIS — Z1151 Encounter for screening for human papillomavirus (HPV): Secondary | ICD-10-CM

## 2019-09-18 DIAGNOSIS — Z789 Other specified health status: Secondary | ICD-10-CM

## 2019-09-18 LAB — POCT PREGNANCY, URINE: Preg Test, Ur: NEGATIVE

## 2019-09-18 NOTE — Procedures (Signed)
Pap Smear, Endocervical Polyp Biopsy, and Endometrial Biopsy Procedure Note  Pre-operative Diagnosis: AUB  Post-operative Diagnosis: same. EC polyp  Procedure Details  Urine pregnancy test was done and result was negative.  The risks (including infection, bleeding, pain, and uterine perforation) and benefits of the procedure were explained to the patient and Written informed consent was obtained.  The patient was placed in the dorsal lithotomy position. A Graves' speculum inserted in the vagina, and the cervix was visualized and a pap smear performed, making sure to swab the polyp too, in addition to sampling the EC canal. The cervix was then prepped with povidone iodine and a biopsy was taken with the tischlar forceps and silver nitrate applied to the area; the polyp is mildly friable. A pipelle was inserted into the uterine cavity and sounded the uterus to a depth of 9cm.  A Small amount of tissue and large amount of blood was collected after 2 passes. The sample was sent for pathologic examination.  Condition: Stable  Complications: None  Plan: The patient was advised to call for any fever or for prolonged or severe pain or bleeding. She was advised to use OTC analgesics as needed for mild to moderate pain. She was advised to be on pelvic rest until the work up is complete.  Durene Romans MD Attending Center for [employer])

## 2019-09-18 NOTE — Progress Notes (Signed)
Obstetrics and Gynecology New Patient Evaluation  Appointment Date: 09/18/2019  OBGYN Clinic: [facility]  Primary Care Provider: Patient, No Pcp Per  Referring Provider: ER  Chief Complaint:  Chief Complaint  Patient presents with  . Metrorrhagia    History of Present Illness: Joan Poole is a 48 y.o. Hispanic [id number] (No LMP recorded. (Menstrual status: Irregular Periods).), seen for the above chief complaint. Her past medical history is significant for nothing.  Patient seen in ED on 9/4 for irregular bleeding. Patient states that starting with her last period on 8/14 and then has had irregular bleeding about every week or two with ?period starting yesterday.   ? Hot flashes and vaginal dryness. No night sweats, vaginal discharge, current abdominal pain.    Review of Systems:  as noted in the History of Present Illness.  Past Medical History:  Past Medical History:  Diagnosis Date  . Gestational diabetes     Past Surgical History:  Past Surgical History:  Procedure Laterality Date  . NO PAST SURGERIES      Past Obstetrical History:  OB History  Gravida Para Term Preterm AB Living  4 4 4     4   SAB TAB Ectopic Multiple Live Births          4    # Outcome Date GA Lbr Len/2nd Weight Sex Delivery Anes PTL Lv  4 Term 08/02/12 [redacted]w[redacted]d 15:00 / 00:12 5 lb 14.4 oz (2.676 kg) M Vag-Spont EPI  LIV     Birth Comments: WNL  3 Term 07/04/05 [redacted]w[redacted]d 01:00 7 lb (3.175 kg) M Vag-Spont EPI  LIV  2 Term 09/01/00 [redacted]w[redacted]d  7 lb 15 oz (3.6 kg) M Vag-Spont EPI  LIV  1 Term 12/30/96 [redacted]w[redacted]d  6 lb 9.8 oz (3 kg) F Vag-Spont EPI  LIV    Past Gynecological History: As per HPI. Periods: before August 2020, qmonth, regular no intermenstrual bleeding, 3-4 days, not heavy or painful History of Pap Smear(s): Yes.   Last pap unknown, which was normal per patient She is currently using no method for contraception.   Social History:  Social History   Socioeconomic  History  . Marital status: Married    Spouse name: Not on file  . Number of children: Not on file  . Years of education: Not on file  . Highest education level: Not on file  Occupational History  . Not on file  Social Needs  . Financial resource strain: Not on file  . Food insecurity    Worry: Not on file    Inability: Not on file  . Transportation needs    Medical: Not on file    Non-medical: Not on file  Tobacco Use  . Smoking status: Never Smoker  . Smokeless tobacco: Never Used  Substance and Sexual Activity  . Alcohol use: No  . Drug use: No  . Sexual activity: Not Currently    Birth control/protection: None  Lifestyle  . Physical activity    Days per week: Not on file    Minutes per session: Not on file  . Stress: Not on file  Relationships  . Social 06-18-1992 on phone: Not on file    Gets together: Not on file    Attends religious service: Not on file    Active member of club or organization: Not on file    Attends meetings of clubs or organizations: Not on file    Relationship status: Not on  file  . Intimate partner violence    Fear of current or ex partner: Not on file    Emotionally abused: Not on file    Physically abused: Not on file    Forced sexual activity: Not on file  Other Topics Concern  . Not on file  Social History Narrative  . Not on file    Family History:  Family History  Problem Relation Age of Onset  . Healthy Mother    She denies any female cancer  Medications None  Allergies Patient has no known allergies.   Physical Exam:  Wt 152 lb 9.6 oz (69.2 kg)   BMI 29.80 kg/m  Body mass index is 29.8 kg/m. General appearance: Well nourished, well developed female in no acute distress.  Neck:  Supple, normal appearance, and no thyromegaly  Cardiovascular: normal s1 and s2.  No murmurs, rubs or gallops. Respiratory:  Clear to auscultation bilateral. Normal respiratory effort Abdomen: positive bowel sounds and no  masses, hernias; diffusely non tender to palpation, non distended Neuro/Psych:  Normal mood and affect.  Skin:  Warm and dry.  Lymphatic:  No inguinal lymphadenopathy.   Pelvic exam: is not limited by body habitus EGBUS: within normal limits, Vagina: within normal limits and with no blood or discharge in the vault, Cervix: large endocervical polyp. It appears to be coming from the 9 to 11 o'clock of the cervix and does go into the canal. Overall size of polyp that I can see is about 2.5-3cm in length and 2cm in width and depth.  Uterus:  nonenlarged and non tender and Adnexa:  normal adnexa and no mass, fullness, tenderness Rectovaginal: deferred  See procedure note for pap smear, EC polyp bx and embx.   Laboratory: from the ER, negative upt, gc/ct/trich  Radiology: none  Assessment: AUB, likely from [facility] polyp  Plan:  1. Abnormal uterine bleeding (AUB) D/w her that most likely benign polyp but recommend lab work up and u/s to rule out endocrine, malignancy and to see if can tell how far polyp extends to see if can be removed in the office or OR.  - Cytology - PAP - Surgical pathology( [place]) - TSH - Prolactin - US PELVIC COMPLETE WITH TRANSVAGINAL; Future - CBC  2. Language barrier Interpreter used  RTC for visit after u/s is done  Durene Romans MD Attending Center for [facility])

## 2019-09-18 NOTE — Progress Notes (Signed)
Aug 14th regular  Aug 25th spotting periods  Sept 4 appointment light spotting period through September 9th  And then yesterday it started again regular flow.

## 2019-09-19 LAB — CBC
Hematocrit: 42.1 % (ref 34.0–46.6)
Hemoglobin: 14.1 g/dL (ref 11.1–15.9)
MCH: 29.9 pg (ref 26.6–33.0)
MCHC: 33.5 g/dL (ref 31.5–35.7)
MCV: 89 fL (ref 79–97)
Platelets: 235 10*3/uL (ref 150–450)
RBC: 4.71 x10E6/uL (ref 3.77–5.28)
RDW: 13.2 % (ref 11.7–15.4)
WBC: 8.1 10*3/uL (ref 3.4–10.8)

## 2019-09-19 LAB — TSH: TSH: 1.3 u[IU]/mL (ref 0.450–4.500)

## 2019-09-19 LAB — PROLACTIN: Prolactin: 17.1 ng/mL (ref 4.8–23.3)

## 2019-09-20 LAB — CYTOLOGY - PAP
Diagnosis: NEGATIVE
High risk HPV: NEGATIVE
Molecular Disclaimer: 56
Molecular Disclaimer: NORMAL

## 2019-09-20 LAB — SURGICAL PATHOLOGY

## 2019-09-26 ENCOUNTER — Other Ambulatory Visit: Payer: Self-pay

## 2019-09-26 ENCOUNTER — Ambulatory Visit (HOSPITAL_COMMUNITY)
Admission: RE | Admit: 2019-09-26 | Discharge: 2019-09-26 | Disposition: A | Discharge status: Home / Self Care | Payer: Self-pay | Source: Ambulatory Visit | Attending: Obstetrics and Gynecology | Admitting: Obstetrics and Gynecology

## 2019-09-26 DIAGNOSIS — N939 Abnormal uterine and vaginal bleeding, unspecified: Secondary | ICD-10-CM | POA: Insufficient documentation

## 2019-10-18 ENCOUNTER — Ambulatory Visit: Payer: Self-pay | Admitting: Obstetrics and Gynecology

## 2019-10-18 ENCOUNTER — Other Ambulatory Visit: Payer: Self-pay

## 2019-10-18 ENCOUNTER — Encounter: Payer: Self-pay | Admitting: Obstetrics and Gynecology

## 2019-10-18 ENCOUNTER — Other Ambulatory Visit (HOSPITAL_COMMUNITY)
Admission: RE | Admit: 2019-10-18 | Discharge: 2019-10-18 | Disposition: A | Discharge status: Home / Self Care | Payer: Self-pay | Source: Ambulatory Visit | Attending: Obstetrics and Gynecology | Admitting: Obstetrics and Gynecology

## 2019-10-18 VITALS — BP 151/85 | HR 113 | Wt 151.5 lb

## 2019-10-18 DIAGNOSIS — N841 Polyp of cervix uteri: Secondary | ICD-10-CM

## 2019-10-18 NOTE — Procedures (Signed)
  Cervical Polypectomy Electrosurgical Excisional Procedure Note  Pre-operative Diagnosis: AUB. Cervical polyp  Post-operative Diagnosis: Same.   Procedure Details   The risks (including infection, bleeding, pain, preterm birth, cervical stenosis) and benefits of the procedure were explained to the patient and written informed consent was obtained.  The patient was placed in the dorsal lithotomy position. A coated Graves was speculum inserted in the vagina, and the cervix was visualized with colposcope.  Cervix appeared to be about 2-3cm in length with stalk coming from the 10 o'clock position.   The cervical stromal bed was injected with 34mL of 1% lidocaine with epinephrine.  Using the top hat electrode on 50/50 blend current, starting proximally at the base of the stalk and moving distally the stalk and polyp were removed.  Another small portion at the base was removed to ensure that no remnant remained. Using the ball electrode on 50 coagulation, the operative area was coagulated and Monsel's applied for excellent hemostasis  Specimens: cervical polyp and stalk and stalk base  Condition: Stable  Complications: None  Plan: The patient was advised to call for any fever or for prolonged or severe pain or bleeding. She was advised to use OTC analgesics as needed for mild to moderate pain. She was advised to be on pelvic rest until her post procedure visit.   Interpreter used.    Durene Romans MD Attending Center for [facility] [employer])

## 2019-10-18 NOTE — Progress Notes (Signed)
Spanish Interpreter Eda R. 

## 2019-10-19 ENCOUNTER — Encounter: Payer: Self-pay | Admitting: Obstetrics and Gynecology

## 2019-10-19 HISTORY — PX: CERVICAL POLYPECTOMY: SHX88

## 2019-10-20 ENCOUNTER — Encounter: Payer: Self-pay | Admitting: *Deleted

## 2019-10-20 LAB — SURGICAL PATHOLOGY

## 2019-11-15 ENCOUNTER — Other Ambulatory Visit: Payer: Self-pay

## 2019-11-15 ENCOUNTER — Ambulatory Visit (INDEPENDENT_AMBULATORY_CARE_PROVIDER_SITE_OTHER): Payer: Self-pay | Admitting: Obstetrics and Gynecology

## 2019-11-15 ENCOUNTER — Encounter: Payer: Self-pay | Admitting: Obstetrics and Gynecology

## 2019-11-15 VITALS — BP 143/82 | HR 71 | Wt 150.6 lb

## 2019-11-15 DIAGNOSIS — Z09 Encounter for follow-up examination after completed treatment for conditions other than malignant neoplasm: Secondary | ICD-10-CM

## 2019-11-15 NOTE — Progress Notes (Signed)
[facility] 11/15/2019  CC: follow up in office cervical polypectomy  Subjective:    S/p 10/29 above procedure. Negative path and patient had period about 10d for 3 days and no issues. No bleeding, pain, d/c currently   Objective:    BP (!) 143/82   Pulse 71   Wt 150 lb 9.6 oz (68.3 kg)   LMP 11/09/2019   BMI 29.41 kg/m  General:  alert  Abdomen: soft, nttp  Pelvic:   egbus normal Vaginal vault: normal Cervix: negative, well healed, patent os with passing cytobrush     Assessment:    Doing well postoperatively. Operative findings again reviewed. Pathology report discussed.    Plan:   Okay to come off pelvic rest. I told her I don't anticipate the polyp coming back bcccp info for mammograms given  RTC: PRN  Interpreter used  Durene Romans MD Attending Center for [facility] (Faculty Practice) 11/15/2019 Time: [phone]

## 2019-11-21 ENCOUNTER — Encounter: Payer: Self-pay | Admitting: *Deleted

## 2019-11-22 ENCOUNTER — Other Ambulatory Visit: Payer: Self-pay | Admitting: Obstetrics and Gynecology

## 2019-11-22 DIAGNOSIS — Z1231 Encounter for screening mammogram for malignant neoplasm of breast: Secondary | ICD-10-CM

## 2020-01-04 ENCOUNTER — Ambulatory Visit: Payer: Self-pay | Admitting: Family Medicine

## 2020-01-12 ENCOUNTER — Ambulatory Visit: Payer: Self-pay | Attending: Family Medicine | Admitting: Family Medicine

## 2020-01-12 ENCOUNTER — Other Ambulatory Visit: Payer: Self-pay

## 2020-01-12 ENCOUNTER — Encounter: Payer: Self-pay | Admitting: Family Medicine

## 2020-01-12 VITALS — BP 142/83 | HR 105 | Wt 156.0 lb

## 2020-01-12 DIAGNOSIS — R7303 Prediabetes: Secondary | ICD-10-CM

## 2020-01-12 DIAGNOSIS — Z8632 Personal history of gestational diabetes: Secondary | ICD-10-CM

## 2020-01-12 DIAGNOSIS — R030 Elevated blood-pressure reading, without diagnosis of hypertension: Secondary | ICD-10-CM

## 2020-01-12 LAB — POCT GLYCOSYLATED HEMOGLOBIN (HGB A1C): HbA1c, POC (controlled diabetic range): 6.2 % (ref 0.0–7.0)

## 2020-01-12 LAB — GLUCOSE, POCT (MANUAL RESULT ENTRY): POC Glucose: 168 mg/dL — AB (ref 70–99)

## 2020-01-12 MED ORDER — METFORMIN HCL 500 MG PO TABS: 500.0000 mg | ORAL_TABLET | Freq: Two times a day (BID) | ORAL | 5 refills | Status: DC

## 2020-01-12 NOTE — Progress Notes (Signed)
Subjective:  Patient ID: Joan Poole, female    DOB: 1971/04/20  Age: 49 y.o. MRN: [id number]  CC: New Patient (Initial Visit)  Due to language barrier, Stratus video interpretation system used at today's visit.  HPI Joan Poole, 49 year old Hispanic female, new to the practice who presents to establish care.  Our review of medical records, she has past history significant for gestational diabetes.  She reports that overall she feels well and has no complaints at today's visit.  She has had no issues with increased thirst, no urinary frequency and no blurred vision.  She feels that she is in good health.  Past Medical History:  Diagnosis Date  . Gestational diabetes     Past Surgical History:  Procedure Laterality Date  . CERVICAL POLYPECTOMY  10/19/2019        Family History  Problem Relation Age of Onset  . Healthy Mother     Social History   Tobacco Use  . Smoking status: Never Smoker  . Smokeless tobacco: Never Used  Substance Use Topics  . Alcohol use: No    ROS Review of Systems  Constitutional: Negative for chills, fatigue and fever.  HENT: Negative for sore throat and trouble swallowing.   Eyes: Negative for photophobia and visual disturbance.  Respiratory: Negative for cough and shortness of breath.   Cardiovascular: Negative for chest pain, palpitations and leg swelling.  Gastrointestinal: Negative for abdominal pain, constipation, diarrhea and nausea.  Endocrine: Negative for cold intolerance, heat intolerance, polydipsia, polyphagia and polyuria.  Genitourinary: Negative for dysuria and frequency.  Musculoskeletal: Negative for arthralgias and back pain.  Neurological: Negative for dizziness and headaches.  Hematological: Negative for adenopathy. Does not bruise/bleed easily.    Objective:   Today's Vitals: BP (!) 142/83   Pulse (!) 105   Wt 156 lb (70.8 kg)   LMP 01/05/2020 (Exact Date)   SpO2 100%   BMI 30.47 kg/m    Physical Exam Constitutional:      Appearance: Normal appearance.  Cardiovascular:     Rate and Rhythm: Normal rate and regular rhythm.  Pulmonary:     Effort: Pulmonary effort is normal.     Breath sounds: Normal breath sounds.  Abdominal:     Palpations: Abdomen is soft.     Tenderness: There is no abdominal tenderness. There is no right CVA tenderness, left CVA tenderness, guarding or rebound.  Musculoskeletal:        General: No tenderness.     Cervical back: Normal range of motion and neck supple. No tenderness.     Right lower leg: No edema.     Left lower leg: No edema.  Lymphadenopathy:     Cervical: No cervical adenopathy.  Neurological:     General: No focal deficit present.     Mental Status: She is alert and oriented to person, place, and time.  Psychiatric:        Mood and Affect: Mood normal.        Behavior: Behavior normal.     Assessment & Plan:  1. History of gestational diabetes Review of chart, patient with a history of gestational diabetes with abnormal 3-hour glucose tolerance test on 04/29/2012.  She denies any current diabetic symptoms such as increased thirst, frequent urination or blurred vision.  Will check random glucose and hemoglobin A1c at today's visit and follow-up of her history of gestational diabetes. - Glucose (CBG) - HgB A1c  2. Prediabetes Patient with elevated random glucose of 168  and hemoglobin A1c elevated at 6.2 consistent with diagnosis of prediabetes which was discussed with the patient at today's visit.  She was provided with educational material in Spanish at today's visit on preventing type 2 diabetes.  Low carbohydrate/no concentrated sweets diet discussed.  Patient agreed to start the use of Glucophage 500 mg twice daily to help with blood sugar regulation and insulin resistance.  We will also check comprehensive metabolic panel in follow-up of prediabetes. - Comprehensive metabolic panel - metFORMIN (GLUCOPHAGE) 500 MG tablet;  Take 1 tablet (500 mg total) by mouth 2 (two) times daily with a meal.  Dispense: 60 tablet; Refill: 5  3. Single episode of elevated blood pressure Blood pressure was slightly elevated at today's visit and she was given information in Spanish on preventing hypertension.  4.  Language barrier Stratus video interpretation system used at today's visit to help with language barrier  Outpatient Encounter Medications as of 01/12/2020  Medication Sig  . metFORMIN (GLUCOPHAGE) 500 MG tablet Take 1 tablet (500 mg total) by mouth 2 (two) times daily with a meal.   No facility-administered encounter medications on file as of 01/12/2020.    An After Visit Summary was printed and given to the patient.   Follow-up: Return in about 4 months (around 05/11/2020) for prediabetes; 2 week nurse visit for BP check.    Cain Saupe MD

## 2020-01-12 NOTE — Patient Instructions (Addendum)
Prevencin de la diabetes mellitus tipo 2 Preventing Type 2 Diabetes Mellitus La diabetes tipo 2 (diabetes mellitus tipo 2) es una enfermedad a largo plazo (crnica) que afecta los niveles de azcar en la sangre (glucosa). Normalmente, una hormona denominada insulina permite el ingreso de la glucosa en las clulas del cuerpo. Las clulas usan la glucosa para Dealer. En la diabetes tipo 2, puede presentarse uno de los siguientes problemas, o ambos:  El cuerpo no produce la cantidad suficiente de insulina.  El cuerpo no responde de [place] a la insulina que produce (resistencia a la insulina). La resistencia a la insulina o la falta de insulina hacen que el exceso de glucosa se acumule en la sangre, en lugar de ir a las clulas. Como resultado, se produce un nivel alto de glucemia (hiperglucemia), lo cual puede causar muchas complicaciones. Al tener sobreseo o ser obeso y tener un estilo de vida inactivo (sedentario) puede aumentar su riesgo de padecer diabetes. La diabetes tipo 2 se puede retrasar o prevenir al realizar determinados cambios en la alimentacin y el estilo de vida. Qu cambios se pueden Engineer, structural?  Consuma comidas y refrigerios saludables con regularidad. Tenga a mano un refrigerio saludable cuando sienta [employer], tal como una fruta o un puado de nueces.  Como carnes [place] y protenas con bajo contenido de grasas saturadas como pollo, pescado, claras de huevo y frijoles. Evite las carnes procesadas.  Coma muchas frutas y verduras, y muchos granos no procesados (granos enteros). Se recomienda que coma: ? 1 a 2 tazas de J. [employer]ney. ? 2 a 3 tazas de [employer]. ? 6 a 8 onzas de granos enteros todos los das, como avena, [place], trigo [place]sie, arroz integral, quinoa y mijo.  Coma productos lcteos de bajo contenido graso, como [place], yogur y [place].  Coma alimentos que contengan grasas saludables, como  frutos secos, aguacate, aceite de [place] y aceite de canola.  Beba agua [place]. Evite los lquidos que contengan azcar agregada como gaseosas o t [place].  Siga las indicaciones de su mdico respecto de las restricciones especficas para las comidas o las bebidas.  Controle la cantidad de alimentos que come por vez (tamao de la porcin). ? Verifique las etiquetas de los alimentos para verificar los tamaos de las porciones de los alimentos. ? Use una balanza de cocina para pesar las porciones de alimentos.  [place] o hierva al vapor los Building surveyor de frerlos. Cocine con agua o caldo en lugar de aceites o manteca.  Limite su consumo de: ? Sal (sodio). No consuma ms de 1 cucharadita (2,400mg ) de Electrical engineer. Si tiene enfermedad cardaca o presin arterial alta, debe consumir menos de  a  cucharadita (1,500mg ) de sodio por da. ? Grasas saturadas. Se trata de grasa slida a temperatura [employer] o la grasa de la carne. Qu cambios en el estilo de vida se pueden realizar?  Actividad  Haga actividad fsica de intensidad moderada durante al menos 28minutos como mnimo 5das por semana o con la frecuencia que le indique su mdico.  Pregntele al mdico qu actividades son seguras para usted. Una combinacin de actividades puede ser la mejor opcin, por ejemplo, caminar, practicar natacin, andar en bicicleta y hacer entrenamiento de fuerza.  Trate de agregar actividad fsica a su jornada. Por ejemplo: ? Estacione en lugares ms alejados de lo habitual para tener que caminar ms. Por ejemplo, estacione en  una equina alejada del estacionamiento cuando vaya a la oficina o a la tienda de comestibles. ? Vaya a caminar durante su hora de almuerzo. ? Utilice las Microbiologist de ascensores o escaleras mecnicas. Bajar de peso  Staves de peso segn se le indique. El mdico puede determinar cuntos kilos tiene que bajar y [place] a que adelgace de Wellsite geologist  segura.  Si tiene sobrepeso o es obeso, es posible que se le indique que pierda al menos entre el 5 y el 7% de su Runner, broadcasting/film/video. Alcohol y tabaco   Limite el consumo de alcohol a no ms de 1 medida por da si es mujer y no est Orthoptist y a 2 medidas por da si es hombre. Una medida equivale a 12onzas de cerveza, 5onzas de vino o 1onzas de bebidas alcohlicas de alta graduacin.  No consuma ningn producto que contenga tabaco, lo que incluye cigarrillos, tabaco de Theatre manager y Administrator, Civil Service. Si necesita ayuda para dejar de fumar, consulte al mdico. Trabaje con su mdico  Contrlese la glucemia de [place] regular, segn lo indicado por su mdico.  Analice sus factores de riesgo y cmo puede reducir su riesgo de padecer diabetes.  Somtase a pruebas de Airline pilot segn lo indicado por su mdico. Puede realizarse pruebas de deteccin con regularidad, especialmente si tiene ciertos factores de riesgo de padecer diabetes tipo 2.  Haga una cita con un especialista en dietas y nutricin (nutricionista matriculado). Un nutricionista matriculado puede ayudarlo a crear un plan de alimentacin saludable y a comprender los tamaos de las porciones y las etiquetas de los alimentos. Por qu son importantes estos cambios?  Es posible prevenir o Cytogeneticist diabetes tipo 2 y los problemas de salud relacionados al realizar cambios en el estilo de vida y Psychologist, sport and exercise.  Puede ser difcil reconocer los signos de la diabetes tipo 2. La mejor manera de evitar posibles daos en el cuerpo es tomar medidas para prevenir la enfermedad antes que usted desarrolle los sntomas. Qu puede suceder si no se realizan cambios?  Sus niveles de glucemia pueden continuar aumentando. Tener la glucemia alta durante mucho tiempo es peligroso. Demasiada glucosa en la sangre puede daar los vasos sanguneos, el corazn, los riones, los nervios y los ojos.  Puede desarrollar prediabetes o diabetes tipo 2. La  diabetes tipo 2 puede ocasionar muchos problemas de salud crnicos y complicaciones, tales como: ? Enfermedad cardaca. ? Accidente cerebrovascular. ? Ceguera. ? Enfermedad renal. ? Depresin. ? Circulacin deficiente en los pies y las piernas, lo cual podra ocasionar una extirpacin quirrgica (amputacin) en casos graves. Dnde encontrar apoyo  Pida a su mdico que le recomiende un nutricionista Marine scientist, IT trainer en diabetes o un programa para Publishing copy de [place].  Busque grupos de apoyo para bajar de peso a nivel local o en lnea.  Asista a un gimnasio, club de acondicionamiento fsico o nase a un grupo que realice actividad fsica al aire libre como un club de caminata. Dnde encontrar ms informacin Para obtener ms informacin sobre la diabetes y la prevencin de la diabetes, visite:  Asociacin Americana de la Diabetes (American Diabetes Association, ADA): www.diabetes.org  The Kroger de la Diabetes y las Enfermedades Digestivas y [facility] of Diabetes and Digestive and Kidney Diseases): ToyArticles.ca Para obtener ms informacin sobre [employer]ac [employer], visite:  Departamento de Agricultura de los EE.UU., Mi plato (U.S. Department of Agriculture, Medical sales representative, Choose My Plate): http://yates.biz/  Oficina para la Prevencin de [place] y Promocin de [place], Government social research officer de Paediatric nurse (  Office of Disease Prevention and Health Promotion, ODPHP, Dietary Guidelines): ListingMagazine.si Resumen  Puede reducir su riesgo de padecer diabetes tipo 2 al aumentar su actividad fsica, comer alimentos saludables y bajar de peso segn se lo indiquen.  Hable con su mdico sobre su riesgo de padecer diabetes tipo 2. Pregunte Amgen Inc de sangre o pruebas de deteccin que debe International aid/development worker. Esta informacin no tiene Theme park manager el consejo del mdico. Asegrese de hacerle al mdico cualquier  pregunta que tenga. Document Revised: 03/29/2017 Document Reviewed: 01/28/2016 Elsevier Patient Education  2020 Elsevier Inc.  Prevencin de la hipertensin Preventing Hypertension La hipertensin, conocida comnmente como presin arterial alta, se produce cuando la sangre bombea en las arterias con mucha fuerza. Las arterias son vasos sanguneos que transportan la sangre desde el corazn al resto del cuerpo. Con el transcurso del [place], la hipertensin puede daar las arterias y Engineer, manufacturing systems flujo de sangre hacia partes importantes del cuerpo que incluyen el cerebro, el corazn y los riones. Con frecuencia, la hipertensin no causa sntomas hasta que la presin arterial es muy alta. Por este motivo, es importante que controle regularmente su presin arterial. La hipertensin se puede prevenir con frecuencia con cambios en la dieta y el estilo de vida. Si ya tiene hipertensin, puede controlarla con cambios en la dieta y el estilo de vida y con medicamentos. Qu cambios en la alimentacin se pueden hacer? Mantenga una dieta saludable. Esto incluye lo siguiente:  Menor ingesta de sal (sodio). Pregntele al mdico cunto sodio puede consumir de forma segura. La recomendacin general es consumir menos de 1cucharadita (2300mg ) de sodio por da. ? No agregue sal a las comidas. ? Opte por alimentos con bajo contenido de sodio cuando realice las compras o coma fuera de casa.  Limite la cantidad de grasa en la dieta. Esto se puede lograr con o de bajo contenido de grasas e ingiriendo menor cantidad de carnes rojas.  Coma ms frutas, verduras y cereales integrales. Establezca un objetivo para comer: ? 1 a 2tazas de frutas y verduras frescas todos los Enterprise Products. ? 3 a 4porciones de cereales [address]  [address].  Evite los alimentos y las bebidas que tengan azcares agregados.  Coma pescados que contengan grasas saludables (cidos grasos omega-3), como la caballa o el  salmn. Si necesita implementar un plan de comidas saludable, pruebe la dieta DASH. Esta dieta tiene un alto contenido de frutas, verduras y [employer]. Incluye poca cantidad de sodio, carnes rojas y azcares agregados. DASH es la sigla en ingls de "Enfoques Alimentarios para Detener la Hipertensin". Qu cambios en el estilo de vida se pueden realizar?   Baje de peso si es necesario. Con tan solo bajar entre el 3% y el 5% del peso corporal puede prevenir o controlar la hipertensin. ? Por ejemplo, si su peso actual es de 200libras (91kg), una prdida entre el 3% y el 5% de su peso significa perder entre 6 y 10libras (2,7 a 4,5kg). ? Pdale al mdico que le recomiende una dieta y un plan de ejercicios para bajar de peso de forma segura.  Ejerctese lo suficiente. Debe realizar al menos 08-16-2003 de ejercicios de intensidad moderada todas las semanas. ? en sesiones cortas de ejercicios, varias veces al da, o puede realizar sesiones ms largas, pero menos veces por semana. Por ejemplo, puede realizar una caminata enrgica o andar en bicicleta durante Theatre stage manager, 3veces al da, durante 5das a la semana.  Encuentre maneras de reducir  el estrs, como hacer ejercicios, Primary school teachermeditar, Tax adviserescuchar msica o tomar una clase de yoga. Si necesita ayuda para reducir J. C. Penneyel nivel de estrs, consulte al mdico.  No fume. Esto incluye los cigarrillos electrnicos. Las sustancias qumicas presentes en los productos con tabaco y nicotina elevan su presin arterial cada vez que fuma. Si necesita ayuda para dejar de fumar, consulte al mdico.  Evite el alcohol. Si bebe alcohol, limite el consumo a no ms de 1medida por da si es mujer y no est [place]oistownembarazada, y 2medidas por da si es hombre. Una medida equivale a 12onzas de cerveza, 5onzas de vino o 1onzas de bebidas alcohlicas de alta graduacin. Por qu son importantes estos cambios? Los Allied Waste Industriescambios en la dieta y el estilo de  vida pueden ayudar a prevenir la hipertensin y a Passenger transport managerhacerlo sentir mejor al mejorar su calidad de vida. Si tiene hipertensin, Therapist, arthacer estos cambios le ayudarn a Theatre managercontrolarla y a Financial traderprevenir complicaciones ms importantes, como, por ejemplo:  El endurecimiento y Catering managerel estrechamiento de las arterias que proveen sangre a: ? Su corazn. Esto puede producirle un infarto de miocardio. ? Su cerebro. Esto puede ser la causa de un accidente cerebrovascular. ? Los riones. Esto puede causar insuficiencia renal.  Estrs en el msculo cardaco, lo que puede producir insuficiencia cardaca. Qu puedo hacer para reducir mis riesgos?   Trabaje junto al mdico para desarrollar un plan de prevencin de la hipertensin que funcione para usted. Siga su plan y concurra a todas las visitas de control como se lo haya indicado el mdico.  Aprenda a medir su presin arterial en casa. Asegrese de Solicitorconocer su objetivo de presin arterial, como se lo haya indicado el mdico. Cmo se trata? Adems de los cambios en la dieta y el estilo de vida, Oregonel mdico podr indicarle medicamentos para ayudarle a Publishing copybajar su presin arterial. Tal vez deba probar distintos medicamentos hasta encontrar el ms adecuado para usted. Quiz necesite tomar ms de uno. Tome los medicamentos de venta libre y los recetados solamente como se lo haya indicado el mdico. Dnde encontrar apoyo Su mdico puede ayudarle a prevenir la hipertensin y Pharmacologistmantener su presin arterial en un nivel saludable. Su hospital o comunidad locales tambin pueden proporcionarle servicios y programas de prevencin. La Asociacin Americana del [place] (American Heart Association) ofrece un red de soporte en lnea en: https://www.lee.net/http://supportnetwork.heart.org/high-blood-pressure Dnde encontrar ms informacin Obtenga ms informacin sobre la hipertensin en:  Training and development officernstituto Nacional del Programmer, multimediaCorazn, del Pulmn y de Risk managerla [place] (National Heart, Lung, and Blood Institute):  https://www.peterson.org/www.nhlbi.nih.gov/health/health-topics/topics/hbp  Centros para el control y Engineer, agriculturalla prevencin de Child psychotherapistenfermedades (Centers for Disease Control and Prevention, CDC): AboutHD.co.nzwww.cdc.gov/bloodpressure  Market researcherAcademia Estadounidense de Mdicos de [place] crewamilia (American Academy of Family Physicians): http://familydoctor.org/familydoctor/en/diseases-conditions/high-blood-pressure.printerview.all.html Obtenga ms informacin sobre la dieta DASH en:  Training and development officernstituto Nacional del [place] Springsorazn, del Pulmn y de Risk managerla [place] (National Heart, Lung, and Blood Institute): WedMap.itwww.nhlbi.nih.gov/health/health-topics/topics/dash Comunquese con un mdico si:  Piensa que tiene Runner, broadcasting/film/videouna reaccin alrgica a los medicamentos que ha tomado.  Tiene mareos o dolores de cabeza con Naval architectrecurrencia.  Tiene hinchazn en los tobillos.  Tiene problemas de visin. Resumen  La hipertensin con frecuencia no provoca sntomas hasta que la presin arterial es muy alta. Es importante que controle regularmente su presin arterial.  Los cambios en la dieta y el estilo de vida son los pasos ms importantes hacia la prevencin de la hipertensin.  Si mantiene su presin arterial en un nivel saludable, podr prevenir complicaciones como infarto de miocardio, insuficiencia cardaca, accidente cerebrovascular e insuficiencia renal.  Jamal Maesrabaje junto  al mdico para desarrollar un plan de prevencin de la hipertensin que funcione para usted. Esta informacin no tiene Theme park manager el consejo del mdico. Asegrese de hacerle al mdico cualquier pregunta que tenga. Document Revised: 03/17/2017 Document Reviewed: 12/22/2015 Elsevier Patient Education  2020 [employer].

## 2020-01-13 LAB — COMPREHENSIVE METABOLIC PANEL WITH GFR
ALT: 13 IU/L (ref 0–32)
AST: 14 IU/L (ref 0–40)
Albumin/Globulin Ratio: 1.5 (ref 1.2–2.2)
Albumin: 4.3 g/dL (ref 3.8–4.8)
Alkaline Phosphatase: 80 IU/L (ref 39–117)
BUN/Creatinine Ratio: 17 (ref 9–23)
BUN: 10 mg/dL (ref 6–24)
Bilirubin Total: 0.3 mg/dL (ref 0.0–1.2)
CO2: 26 mmol/L (ref 20–29)
Calcium: 9.1 mg/dL (ref 8.7–10.2)
Chloride: 101 mmol/L (ref 96–106)
Creatinine, Ser: 0.59 mg/dL (ref 0.57–1.00)
GFR calc Af Amer: 125 mL/min/1.73
GFR calc non Af Amer: 109 mL/min/1.73
Globulin, Total: 2.9 g/dL (ref 1.5–4.5)
Glucose: 145 mg/dL — ABNORMAL HIGH (ref 65–99)
Potassium: 4.1 mmol/L (ref 3.5–5.2)
Sodium: 138 mmol/L (ref 134–144)
Total Protein: 7.2 g/dL (ref 6.0–8.5)

## 2020-01-19 ENCOUNTER — Other Ambulatory Visit: Payer: Self-pay

## 2020-01-19 ENCOUNTER — Ambulatory Visit
Admission: RE | Admit: 2020-01-19 | Discharge: 2020-01-19 | Disposition: A | Discharge status: Home / Self Care | Payer: No Typology Code available for payment source | Source: Ambulatory Visit | Attending: Obstetrics and Gynecology | Admitting: Obstetrics and Gynecology

## 2020-01-19 DIAGNOSIS — Z1231 Encounter for screening mammogram for malignant neoplasm of breast: Secondary | ICD-10-CM

## 2020-01-24 ENCOUNTER — Other Ambulatory Visit: Payer: Self-pay | Admitting: Obstetrics and Gynecology

## 2020-01-24 DIAGNOSIS — R928 Other abnormal and inconclusive findings on diagnostic imaging of breast: Secondary | ICD-10-CM

## 2020-01-25 ENCOUNTER — Other Ambulatory Visit (HOSPITAL_COMMUNITY): Payer: Self-pay

## 2020-01-26 ENCOUNTER — Other Ambulatory Visit (HOSPITAL_COMMUNITY): Payer: Self-pay | Admitting: *Deleted

## 2020-01-26 ENCOUNTER — Ambulatory Visit: Payer: Self-pay | Admitting: Pharmacist

## 2020-01-26 ENCOUNTER — Other Ambulatory Visit: Payer: Self-pay | Admitting: Obstetrics and Gynecology

## 2020-01-26 DIAGNOSIS — R928 Other abnormal and inconclusive findings on diagnostic imaging of breast: Secondary | ICD-10-CM

## 2020-01-31 ENCOUNTER — Encounter: Payer: Self-pay | Admitting: Obstetrics and Gynecology

## 2020-01-31 DIAGNOSIS — R928 Other abnormal and inconclusive findings on diagnostic imaging of breast: Secondary | ICD-10-CM | POA: Insufficient documentation

## 2020-02-15 ENCOUNTER — Encounter: Payer: Self-pay | Admitting: Women's Health

## 2020-02-15 ENCOUNTER — Other Ambulatory Visit: Payer: Self-pay

## 2020-02-15 ENCOUNTER — Ambulatory Visit: Payer: Self-pay | Admitting: Women's Health

## 2020-02-15 VITALS — BP 156/88 | Temp 97.8°F | Wt 156.0 lb

## 2020-02-15 DIAGNOSIS — Z1239 Encounter for other screening for malignant neoplasm of breast: Secondary | ICD-10-CM

## 2020-02-15 NOTE — Progress Notes (Signed)
[name] is a 49 y.o. female who presents to [facility] clinic today with no complaints.    Pap Smear: Pap not smear completed today. [facility] Pap smear was [facility] and was normal. Per patient has no history of an abnormal Pap smear. Last Pap smear result is available in Epic.   Physical exam: Breasts Breasts symmetrical. No skin abnormalities bilateral breasts. No nipple retraction bilateral breasts. No nipple discharge bilateral breasts. No lymphadenopathy. No lumps palpated bilateral breasts.       Pelvic/Bimanual Pap is not indicated today    Smoking History: Patient has never smoked not referred to quit line.    Patient Navigation: Patient education provided. Access to services provided for patient through [facility] program. Spanish interpreter provided. No transportation provided   Colorectal Cancer Screening: Per patient has never had colonoscopy completed No complaints today.    Breast and Cervical Cancer Risk Assessment: Patient does not have family history of breast cancer, known genetic mutations, or radiation treatment to the chest before age 58. Patient does not have history of cervical dysplasia, immunocompromised, or DES exposure in-utero.  Risk Assessment    Risk Scores      02/15/2020   Last edited by: Joan Rutherford, LPN   5-year risk: 0.7 %   Lifetime risk: 7.2 %          A: BCCCP exam without pap smear Complaint of none.  P: Referred patient to the [facility] for a diagnostic mammogram. Appointment scheduled 02/16/2020 for f/u imaging after screening mammogram showed possible left breast mass 01/19/2020.  Joan Ponto, NP 02/15/2020 2:59 PM

## 2020-02-15 NOTE — Patient Instructions (Signed)
Breast Self-Awareness Breast self-awareness means being familiar with how your breasts look and feel. It involves checking your breasts regularly and reporting any changes to your health care provider. Practicing breast self-awareness is important. Sometimes changes may not be harmful (are benign), but sometimes a change in your breasts can be a sign of a serious medical problem. It is important to learn how to do this procedure correctly so that you can catch problems early, when treatment is more likely to be successful. All women should practice breast self-awareness, including women who have had breast implants. What you need:  A mirror.  A well-lit room. How to do a breast self-exam A breast self-exam is one way to learn what is normal for your breasts and whether your breasts are changing. To do a breast self-exam: Look for changes  1. Remove all the clothing above your waist. 2. Stand in front of a mirror in a room with good lighting. 3. Put your hands on your hips. 4. Push your hands firmly downward. 5. Compare your breasts in the mirror. Look for differences between them (asymmetry), such as: ? Differences in shape. ? Differences in size. ? Puckers, dips, and bumps in one breast and not the other. 6. Look at each breast for changes in the skin, such as: ? Redness. ? Scaly areas. 7. Look for changes in your nipples, such as: ? Discharge. ? Bleeding. ? Dimpling. ? Redness. ? A change in position. Feel for changes Carefully feel your breasts for lumps and changes. It is best to do this while lying on your back on the floor, and again while sitting or standing in the tub or shower with soapy water on your skin. Feel each breast in the following way: 1. Place the arm on the side of the breast you are examining above your head. 2. Feel your breast with the other hand. 3. Start in the nipple area and make -inch (2 cm) overlapping circles to feel your breast. Use the pads of your  three middle fingers to do this. Apply light pressure, then medium pressure, then firm pressure. The light pressure will allow you to feel the tissue closest to the skin. The medium pressure will allow you to feel the tissue that is a little deeper. The firm pressure will allow you to feel the tissue close to the ribs. 4. Continue the overlapping circles, moving downward over the breast until you feel your ribs below your breast. 5. Move one finger-width toward the center of the body. Continue to use the -inch (2 cm) overlapping circles to feel your breast as you move slowly up toward your collarbone. 6. Continue the up-and-down exam using all three pressures until you reach your armpit.  Write down what you find Writing down what you find can help you remember what to discuss with your health care provider. Write down:  What is normal for each breast.  Any changes that you find in each breast, including: ? The kind of changes you find. ? Any pain or tenderness. ? Size and location of any lumps.  Where you are in your menstrual cycle, if you are still menstruating. General tips and recommendations  Examine your breasts every month.  If you are breastfeeding, the best time to examine your breasts is after a feeding or after using a breast pump.  If you menstruate, the best time to examine your breasts is 5-7 days after your period. Breasts are generally lumpier during menstrual periods, and it may   be more difficult to notice changes.  With time and practice, you will become more familiar with the variations in your breasts and more comfortable with the exam. Contact a health care provider if you:  See a change in the shape or size of your breasts or nipples.  See a change in the skin of your breast or nipples, such as a reddened or scaly area.  Have unusual discharge from your nipples.  Find a lump or thick area that was not there before.  Have pain in your breasts.  Have any  concerns related to your breast health. Summary  Breast self-awareness includes looking for physical changes in your breasts, as well as feeling for any changes within your breasts.  Breast self-awareness should be performed in front of a mirror in a well-lit room.  You should examine your breasts every month. If you menstruate, the best time to examine your breasts is 5-7 days after your menstrual period.  Let your health care provider know of any changes you notice in your breasts, including changes in size, changes on the skin, pain or tenderness, or unusual fluid from your nipples. This information is not intended to replace advice given to you by your health care provider. Make sure you discuss any questions you have with your health care provider. Document Revised: 07/26/2018 Document Reviewed: 07/26/2018 Elsevier Patient Education  2020 Elsevier Inc.  

## 2020-02-16 ENCOUNTER — Ambulatory Visit
Admission: RE | Admit: 2020-02-16 | Discharge: 2020-02-16 | Disposition: A | Discharge status: Home / Self Care | Payer: No Typology Code available for payment source | Source: Ambulatory Visit | Attending: Obstetrics and Gynecology | Admitting: Obstetrics and Gynecology

## 2020-02-16 DIAGNOSIS — R928 Other abnormal and inconclusive findings on diagnostic imaging of breast: Secondary | ICD-10-CM

## 2020-03-08 ENCOUNTER — Encounter: Payer: Self-pay | Admitting: Family Medicine

## 2020-05-17 ENCOUNTER — Ambulatory Visit: Payer: Self-pay | Admitting: Family Medicine

## 2020-12-30 ENCOUNTER — Other Ambulatory Visit: Payer: Self-pay | Admitting: Obstetrics and Gynecology

## 2020-12-30 DIAGNOSIS — Z1231 Encounter for screening mammogram for malignant neoplasm of breast: Secondary | ICD-10-CM

## 2021-01-30 ENCOUNTER — Ambulatory Visit: Payer: Self-pay | Admitting: *Deleted

## 2021-01-30 ENCOUNTER — Ambulatory Visit
Admission: RE | Admit: 2021-01-30 | Discharge: 2021-01-30 | Disposition: A | Discharge status: Home / Self Care | Payer: No Typology Code available for payment source | Source: Ambulatory Visit | Attending: Obstetrics and Gynecology | Admitting: Obstetrics and Gynecology

## 2021-01-30 ENCOUNTER — Other Ambulatory Visit: Payer: Self-pay

## 2021-01-30 VITALS — BP 132/80 | Wt 144.5 lb

## 2021-01-30 DIAGNOSIS — Z1239 Encounter for other screening for malignant neoplasm of breast: Secondary | ICD-10-CM

## 2021-01-30 DIAGNOSIS — Z1231 Encounter for screening mammogram for malignant neoplasm of breast: Secondary | ICD-10-CM

## 2021-01-30 NOTE — Patient Instructions (Signed)
Explained breast self awareness with Mena Goes. Patient did not need a Pap smear today due to last Pap smear and HPV typing was 09/18/2019. Let her know BCCCP will cover Pap smears and HPV typing every 5 years unless has a history of abnormal Pap smears. Referred patient to the Breast Center of [facility] for a screening mammogram on the mobile unit. Appointment scheduled Thursday, January 30, 2021 at 1030. Patient escorted to the mobile unit following BCCCP appointment for her screening mammogram. Let patient know the Breast Center will follow up with her within the next couple weeks with results of her mammogram by letter or phone. Mena Goes verbalized understanding.  Kailana Benninger, Kathaleen Maser, RN 10:25 AM

## 2021-01-30 NOTE — Progress Notes (Signed)
Joan Poole is a 50 y.o. female who presents to [facility] clinic today with no complaints.    Pap Smear: Pap smear not completed today. Last Pap smear was 09/18/2019 at [facility] for [facility] and was normal with negative HPV. Per patient has no history of an abnormal Pap smear. Last Pap smear result is available in Epic.   Physical exam: Breasts Breasts symmetrical. No skin abnormalities bilateral breasts. No nipple retraction bilateral breasts. No nipple discharge bilateral breasts. No lymphadenopathy. No lumps palpated bilateral breasts. No complaints of pain or tenderness on exam.      Pelvic/Bimanual Pap is not indicated today per BCCCP guidelines.   Smoking History: Patient has never smoked.   Patient Navigation: Patient education provided. Access to services provided for patient through [facility] program. Spanish interpreter Natale Lay from [facility] provided.   Colorectal Cancer Screening: Per patient has never had colonoscopy completed. No complaints today.    Breast and Cervical Cancer Risk Assessment: Patient does not have family history of breast cancer, known genetic mutations, or radiation treatment to the chest before age 34. Patient does not have history of cervical dysplasia, immunocompromised, or DES exposure in-utero.  Risk Assessment    Risk Scores      01/30/2021 02/15/2020   Last edited by: Narda Rutherford, LPN McGill, Sherie Demetrius Charity, LPN   5-year risk: 0.7 % 0.7 %   Lifetime risk: 7.1 % 7.2 %          A: BCCCP exam without pap smear No complaints.  P: Referred patient to the [facility] of [facility] for a screening mammogram on the mobile unit. Appointment scheduled Thursday, January 30, 2021 at 1030.  Priscille Heidelberg, RN 01/30/2021 10:25 AM

## 2022-03-05 ENCOUNTER — Other Ambulatory Visit: Payer: Self-pay | Admitting: Obstetrics and Gynecology

## 2022-03-05 ENCOUNTER — Other Ambulatory Visit: Payer: Self-pay

## 2022-03-05 ENCOUNTER — Ambulatory Visit: Payer: Self-pay | Admitting: *Deleted

## 2022-03-05 ENCOUNTER — Ambulatory Visit
Admission: RE | Admit: 2022-03-05 | Discharge: 2022-03-05 | Disposition: A | Discharge status: Home / Self Care | Payer: No Typology Code available for payment source | Source: Ambulatory Visit | Attending: Obstetrics and Gynecology | Admitting: Obstetrics and Gynecology

## 2022-03-05 VITALS — BP 146/88 | Wt 146.5 lb

## 2022-03-05 DIAGNOSIS — Z1231 Encounter for screening mammogram for malignant neoplasm of breast: Secondary | ICD-10-CM

## 2022-03-05 NOTE — Patient Instructions (Signed)
Explained breast self awareness with Posey Boyer. Patient did not need a Pap smear today due to last Pap smear and HPV typing was 09/18/2019. Let her know BCCCP will cover Pap smears and HPV typing every 5 years unless has a history of abnormal Pap smears. Referred patient to the [facility] for a screening mammogram on mobile unit. Appointment scheduled Thursday, March 05, 2022 at 1020. Patient aware of appointment and will be there. Let patient know the Breast Center will follow up with her within the next couple weeks with results of her mammogram by letter or phone. Posey Boyer verbalized understanding. ? ?Aiden Rao, Arvil Chaco, RN ?10:14 AM ? ? ? ? ?

## 2022-03-05 NOTE — Progress Notes (Signed)
[name] is a 51 y.o. female who presents to [facility] clinic today with no complaints.  ?  ?Pap Smear: Pap smear not completed today. Last Pap smear was 09/18/2019 at [facility] for [facility] and was normal with negative HPV. Per patient has no history of an abnormal Pap smear. Last Pap smear result is available in Epic. ?  ?Physical exam: ?Breasts ?Breasts symmetrical. No skin abnormalities bilateral breasts. No nipple retraction bilateral breasts. No nipple discharge bilateral breasts. No lymphadenopathy. No lumps palpated bilateral breasts. No complaints of pain or tenderness on exam. ? ?MS DIGITAL SCREENING TOMO BILATERAL ? ?Result Date: 02/03/2021 ?CLINICAL DATA:  Screening. EXAM: DIGITAL SCREENING BILATERAL MAMMOGRAM WITH TOMOSYNTHESIS AND CAD TECHNIQUE: Bilateral screening digital craniocaudal and mediolateral oblique mammograms were obtained. Bilateral screening digital breast tomosynthesis was performed. The images were evaluated with computer-aided detection. COMPARISON:  Previous exam(s). ACR Breast Density Category c: The breast tissue is heterogeneously dense, which may obscure small masses. FINDINGS: There are no findings suspicious for malignancy. IMPRESSION: No mammographic evidence of malignancy. A result letter of this screening mammogram will be mailed directly to the patient. RECOMMENDATION: Screening mammogram in one year. (Code:[id number]-01Y) BI-RADS CATEGORY  1: Negative. Electronically Signed   By: Bary Richard M.D.   On: 02/03/2021 16:17  ? ?MS DIGITAL SCREENING TOMO BILATERAL ? ?Result Date: 01/23/2020 ?CLINICAL DATA:  Screening. EXAM: DIGITAL SCREENING BILATERAL MAMMOGRAM WITH TOMO AND CAD COMPARISON:  None. ACR Breast Density Category c: The breast tissue is heterogeneously dense, which may obscure small masses. FINDINGS: In the left breast, a possible mass warrants further evaluation. In the right breast, no findings suspicious for malignancy. Images were  processed with CAD. IMPRESSION: Further evaluation is suggested for possible mass in the left breast. RECOMMENDATION: Diagnostic mammogram and possibly ultrasound of the left breast. (Code:[id number]) The patient will be contacted regarding the findings, and additional imaging will be scheduled. BI-RADS CATEGORY  0: Incomplete. Need additional imaging evaluation and/or prior mammograms for comparison. Electronically Signed   By: Baird Lyons M.D.   On: 01/23/2020 10:04  ? ?MS DIGITAL DIAG TOMO UNI LEFT ? ?Result Date: 02/16/2020 ?CLINICAL DATA:  Patient was called back from screening mammogram for possible masses in the left breast. EXAM: DIGITAL DIAGNOSTIC LEFT MAMMOGRAM WITH TOMO ULTRASOUND LEFT BREAST COMPARISON:  Previous exam(s). ACR Breast Density Category c: The breast tissue is heterogeneously dense, which may obscure small masses. FINDINGS: Additional imaging of the left breast was performed. There is a 2 cm mass in the lateral aspect of the left breast and a 2.6 cm mass in the medial aspect of the left breast. Targeted ultrasound is performed, showing a benign cyst in the left breast at 10 o'clock 5 cm from the nipple measuring 2.1 x 0.7 x 2.2 cm. There is a benign anechoic cyst in the left breast at 10 o'clock 3 cm from the nipple measuring 0.9 x 0.9 x 0.6 cm. There is a benign cluster of cysts (apocrine metaplasia) in the left breast at 3 o'clock 4 cm from the nipple measuring 1.3 x 0.7 x 0.6 cm. IMPRESSION: Left breast cysts.  No evidence of malignancy in the left breast. RECOMMENDATION: Bilateral screening mammogram in 1 year is recommended. I have discussed the findings and recommendations with the patient. If applicable, a reminder letter will be sent to the patient regarding the next appointment. BI-RADS CATEGORY  2: Benign. Electronically Signed   By: Baird Lyons M.D.   On: 02/16/2020 10:21   ?     ?  Pelvic/Bimanual ?Pap is not indicated today per BCCCP guidelines. ?  ?Smoking History: ?Patient has  never smoked. ?  ?Patient Navigation: ?Patient education provided. Access to services provided for patient through [facility] program. Spanish interpreter Joan Poole from [facility] provided.  ? ?Colorectal Cancer Screening: ?Per patient has never had colonoscopy completed. FIT test given to patient to complete. ? No complaints today.  ?  ?Breast and Cervical Cancer Risk Assessment: ?Patient does not have family history of breast cancer, known genetic mutations, or radiation treatment to the chest before age 62. Patient does not have history of cervical dysplasia, immunocompromised, or DES exposure in-utero. ? ?Risk Assessment   ? ? Risk Scores   ? ?   03/05/2022 01/30/2021  ? Last edited by: Joan Rutherford, LPN McGill, Joan Levy, LPN  ? 5-year risk: 0.8 % 0.7 %  ? Lifetime risk: 7 % 7.1 %  ? ?  ?  ? ?  ? ? ?A: ?BCCCP exam without pap smear ?No complaints. ? ?P: ?Referred patient to the [facility] Center of [facility] for a screening mammogram on mobile unit. Appointment scheduled Thursday, March 05, 2022 at 1020. ? ?Joan Heidelberg, RN ?03/05/2022 10:14 AM   ?

## 2022-04-05 IMAGING — MG MM DIGITAL SCREENING BILAT W/ TOMO AND CAD
8 series · 9 of 24 positions shown · non-contrast
Comparison: Previous exam(s).

CLINICAL DATA: Screening.

EXAM:
DIGITAL SCREENING BILATERAL MAMMOGRAM WITH TOMOSYNTHESIS AND CAD
TECHNIQUE: Bilateral screening digital craniocaudal and mediolateral oblique
mammograms were obtained. Bilateral screening digital breast
tomosynthesis was performed. The images were evaluated with
computer-aided detection.

[L CC synth-2D]
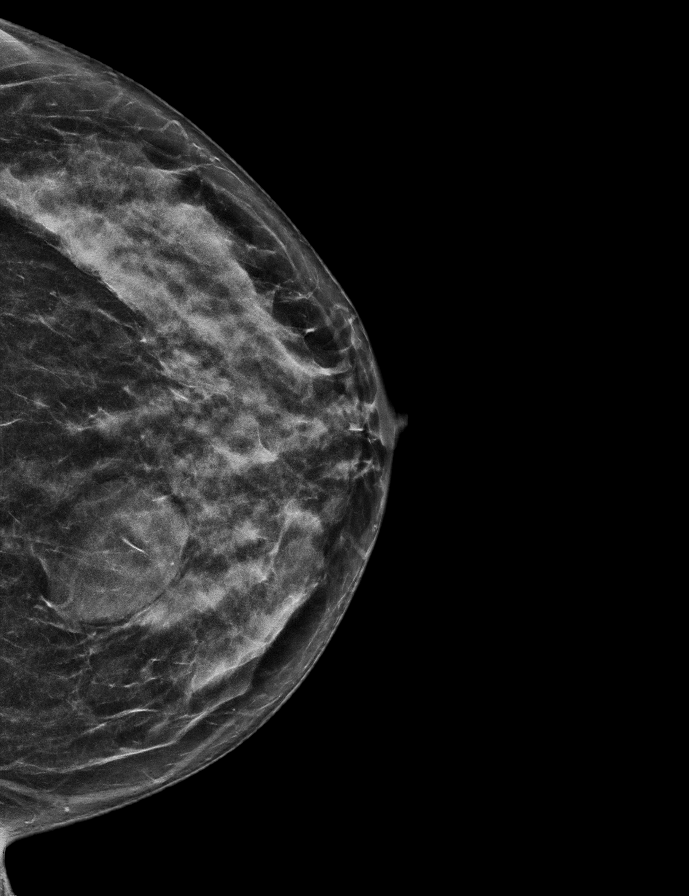

[L MLO synth-2D]
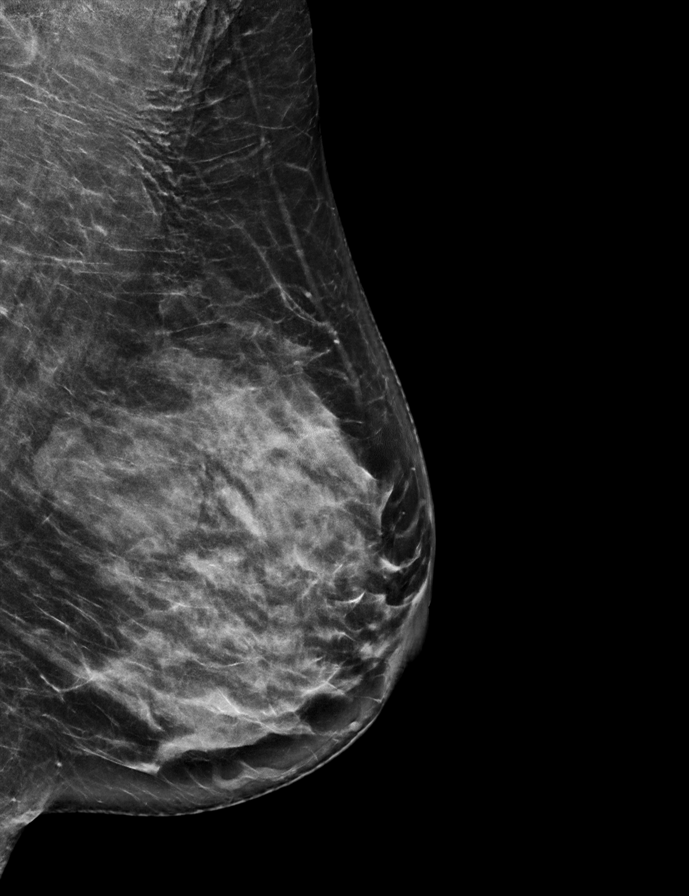

[R CC synth-2D]
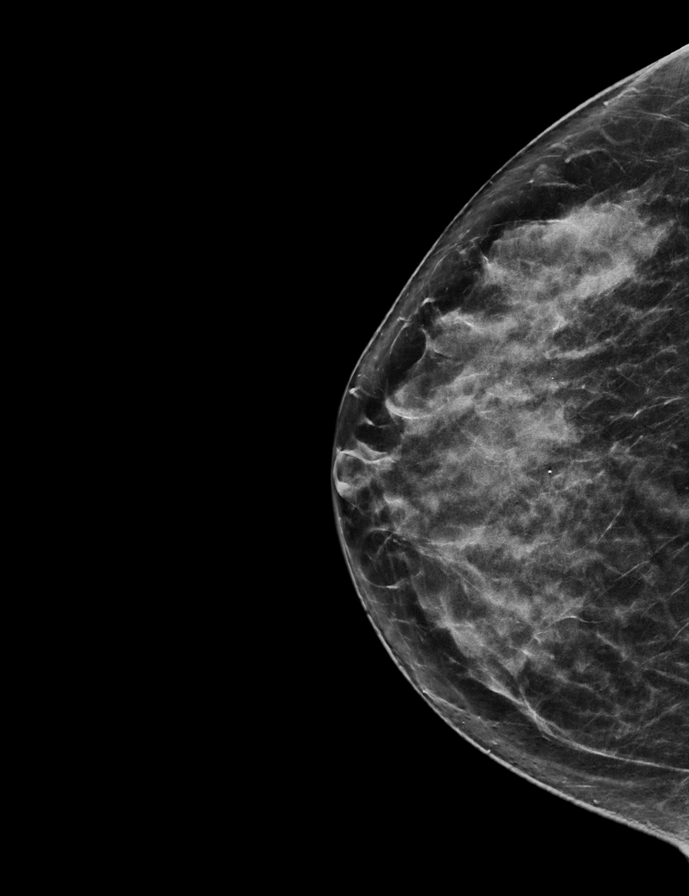

[R MLO synth-2D]
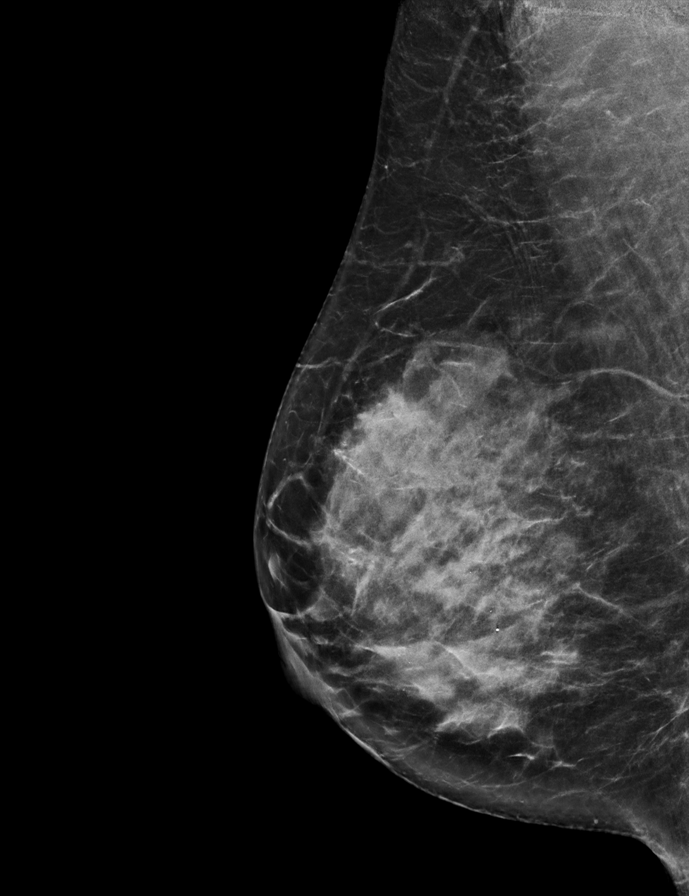

[L CC tomo · 2 of 58 frames shown]
[frame 19/58]
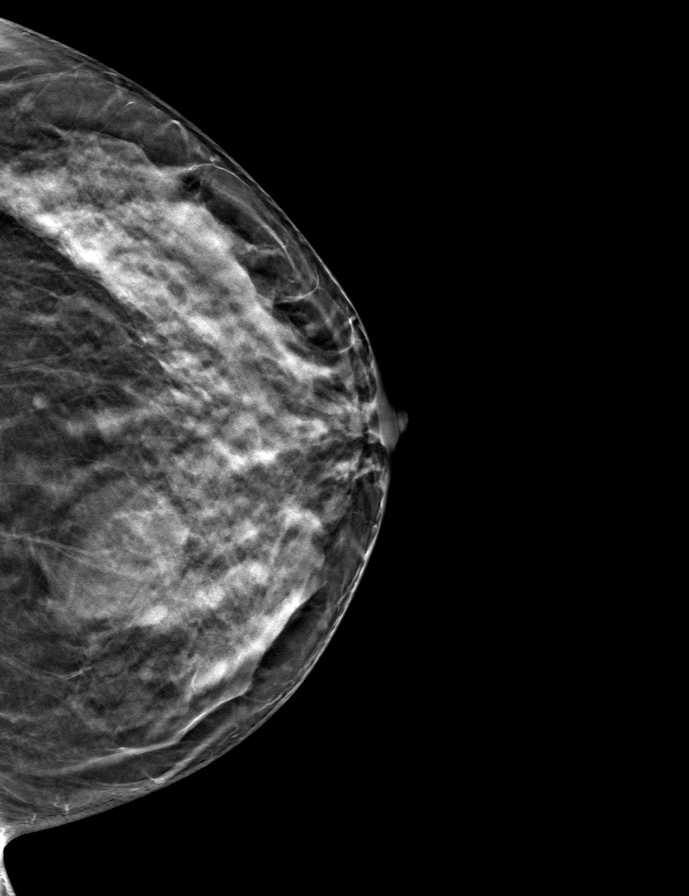
[frame 29/58]
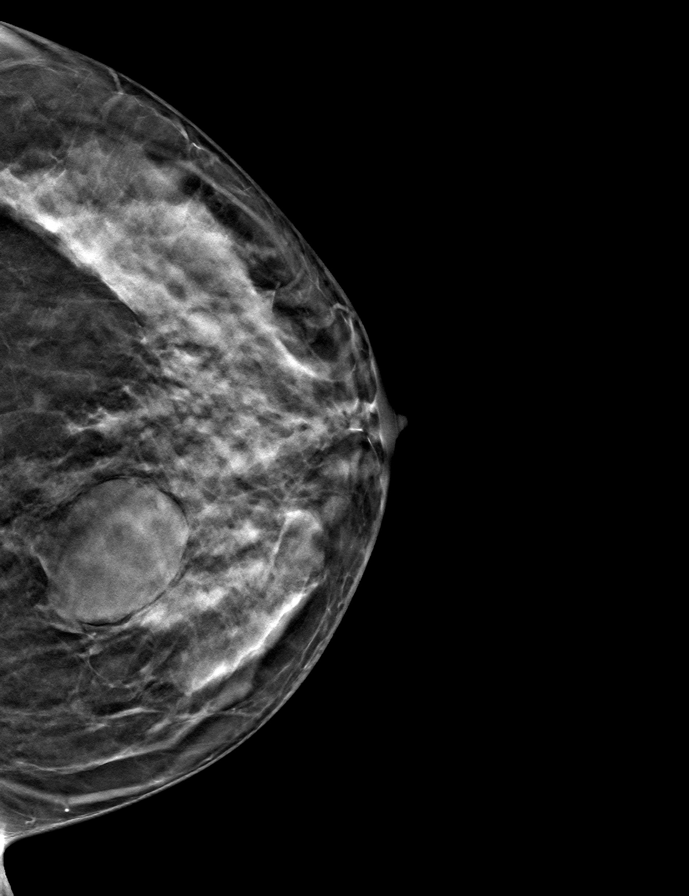

[R MLO tomo · tomo slice 37/73.0]
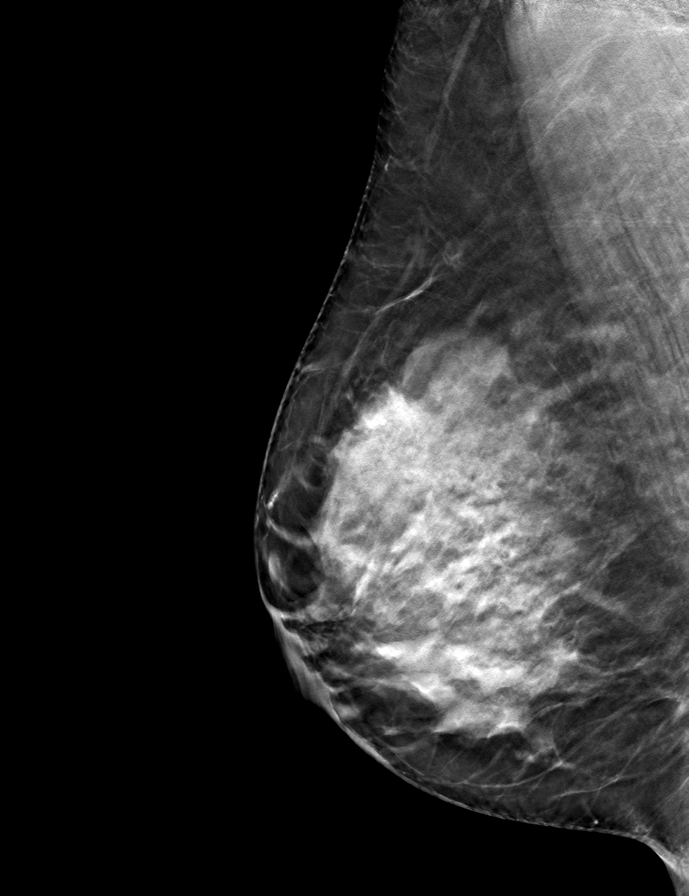

[R CC tomo · tomo slice 33/64.0]
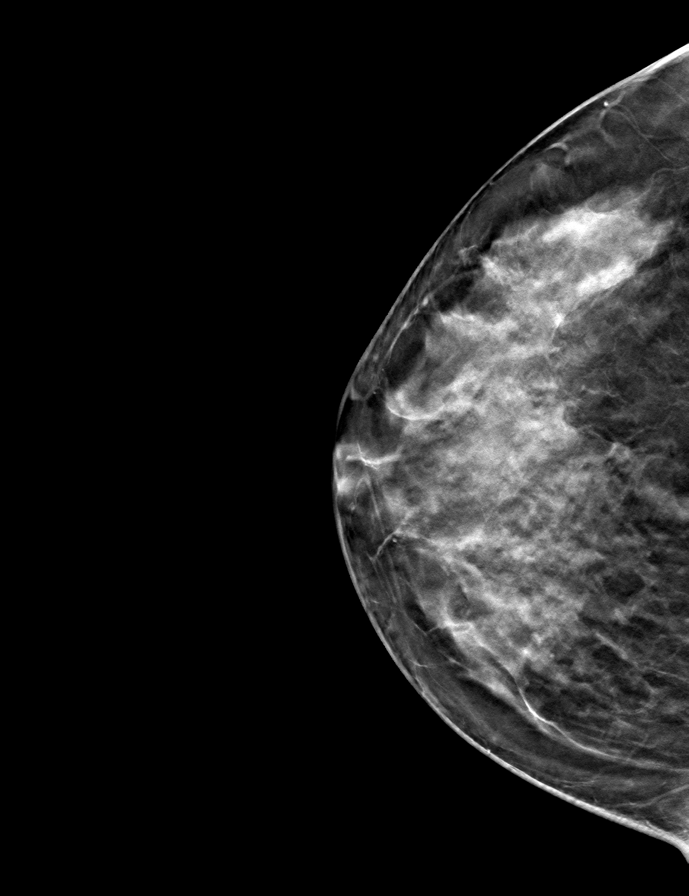

[L MLO tomo · tomo slice 37/72.0]
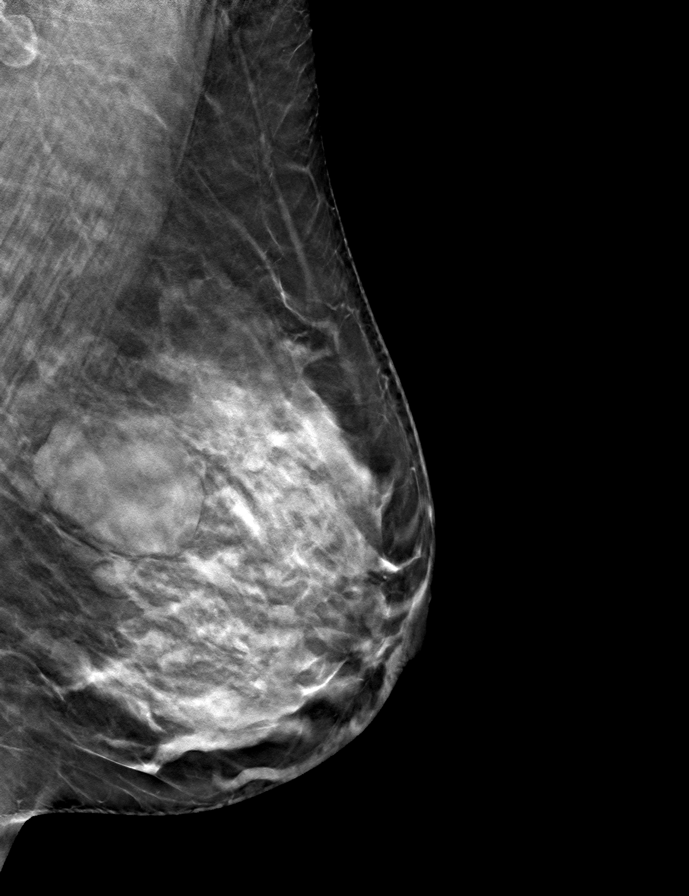

[9 of 24 positions shown; findings below may reference images not displayed]

ACR Breast Density Category d: The breast tissue is extremely dense,
which lowers the sensitivity of mammography
FINDINGS: There are no findings suspicious for malignancy.
IMPRESSION: No mammographic evidence of malignancy. A result letter of this
screening mammogram will be mailed directly to the patient.

RECOMMENDATION:
Screening mammogram in one year. (Code:[id number])

BI-RADS CATEGORY  1: Negative.

## 2023-03-23 ENCOUNTER — Other Ambulatory Visit: Payer: Self-pay | Admitting: Obstetrics and Gynecology

## 2023-03-23 DIAGNOSIS — Z1231 Encounter for screening mammogram for malignant neoplasm of breast: Secondary | ICD-10-CM

## 2023-04-15 ENCOUNTER — Ambulatory Visit: Payer: Self-pay | Admitting: Hematology and Oncology

## 2023-04-15 ENCOUNTER — Other Ambulatory Visit: Payer: Self-pay

## 2023-04-15 ENCOUNTER — Ambulatory Visit
Admission: RE | Admit: 2023-04-15 | Discharge: 2023-04-15 | Disposition: A | Discharge status: Home / Self Care | Payer: No Typology Code available for payment source | Source: Ambulatory Visit | Attending: Obstetrics and Gynecology | Admitting: Obstetrics and Gynecology

## 2023-04-15 VITALS — BP 146/79 | Wt 143.0 lb

## 2023-04-15 DIAGNOSIS — Z1211 Encounter for screening for malignant neoplasm of colon: Secondary | ICD-10-CM

## 2023-04-15 DIAGNOSIS — Z1239 Encounter for other screening for malignant neoplasm of breast: Secondary | ICD-10-CM

## 2023-04-15 DIAGNOSIS — Z1231 Encounter for screening mammogram for malignant neoplasm of breast: Secondary | ICD-10-CM

## 2023-04-15 DIAGNOSIS — Z01419 Encounter for gynecological examination (general) (routine) without abnormal findings: Secondary | ICD-10-CM

## 2023-04-15 NOTE — Progress Notes (Signed)
[name] is a 52 y.o. female who presents to [facility] clinic today with no complaints.    Pap Smear: Pap not smear completed today. Last Pap smear was 09/18/2019 and was normal. Per patient has no history of an abnormal Pap smear. Last Pap smear result is available in Epic.   Physical exam: Breasts Breasts symmetrical. No skin abnormalities bilateral breasts. No nipple retraction bilateral breasts. No nipple discharge bilateral breasts. No lymphadenopathy. No lumps palpated bilateral breasts. MS DIGITAL SCREENING TOMO BILATERAL  Result Date: 03/10/2022 CLINICAL DATA:  Screening. EXAM: DIGITAL SCREENING BILATERAL MAMMOGRAM WITH TOMOSYNTHESIS AND CAD TECHNIQUE: Bilateral screening digital craniocaudal and mediolateral oblique mammograms were obtained. Bilateral screening digital breast tomosynthesis was performed. The images were evaluated with computer-aided detection. COMPARISON:  Previous exam(s). ACR Breast Density Category d: The breast tissue is extremely dense, which lowers the sensitivity of mammography FINDINGS: There are no findings suspicious for malignancy. IMPRESSION: No mammographic evidence of malignancy. A result letter of this screening mammogram will be mailed directly to the patient. RECOMMENDATION: Screening mammogram in one year. (Code:[id number]) BI-RADS CATEGORY  1: Negative. Electronically Signed   By: Sherian Rein M.D.   On: 03/10/2022 08:50   MS DIGITAL SCREENING TOMO BILATERAL  Result Date: 02/03/2021 CLINICAL DATA:  Screening. EXAM: DIGITAL SCREENING BILATERAL MAMMOGRAM WITH TOMOSYNTHESIS AND CAD TECHNIQUE: Bilateral screening digital craniocaudal and mediolateral oblique mammograms were obtained. Bilateral screening digital breast tomosynthesis was performed. The images were evaluated with computer-aided detection. COMPARISON:  Previous exam(s). ACR Breast Density Category c: The breast tissue is heterogeneously dense, which may obscure small masses. FINDINGS:  There are no findings suspicious for malignancy. IMPRESSION: No mammographic evidence of malignancy. A result letter of this screening mammogram will be mailed directly to the patient. RECOMMENDATION: Screening mammogram in one year. (Code:[id number]) BI-RADS CATEGORY  1: Negative. Electronically Signed   By: Bary Richard M.D.   On: 02/03/2021 16:17   MS DIGITAL DIAG TOMO UNI LEFT  Result Date: 02/16/2020 CLINICAL DATA:  Patient was called back from screening mammogram for possible masses in the left breast. EXAM: DIGITAL DIAGNOSTIC LEFT MAMMOGRAM WITH TOMO ULTRASOUND LEFT BREAST COMPARISON:  Previous exam(s). ACR Breast Density Category c: The breast tissue is heterogeneously dense, which may obscure small masses. FINDINGS: Additional imaging of the left breast was performed. There is a 2 cm mass in the lateral aspect of the left breast and a 2.6 cm mass in the medial aspect of the left breast. Targeted ultrasound is performed, showing a benign cyst in the left breast at 10 o'clock 5 cm from the nipple measuring 2.1 x 0.7 x 2.2 cm. There is a benign anechoic cyst in the left breast at 10 o'clock 3 cm from the nipple measuring 0.9 x 0.9 x 0.6 cm. There is a benign cluster of cysts (apocrine metaplasia) in the left breast at 3 o'clock 4 cm from the nipple measuring 1.3 x 0.7 x 0.6 cm. IMPRESSION: Left breast cysts.  No evidence of malignancy in the left breast. RECOMMENDATION: Bilateral screening mammogram in 1 year is recommended. I have discussed the findings and recommendations with the patient. If applicable, a reminder letter will be sent to the patient regarding the next appointment. BI-RADS CATEGORY  2: Benign. Electronically Signed   By: Baird Lyons M.D.   On: 02/16/2020 10:21   MS DIGITAL SCREENING TOMO BILATERAL  Result Date: 01/23/2020 CLINICAL DATA:  Screening. EXAM: DIGITAL SCREENING BILATERAL MAMMOGRAM WITH TOMO AND CAD COMPARISON:  None. ACR Breast  Density Category c: The breast tissue is  heterogeneously dense, which may obscure small masses. FINDINGS: In the left breast, a possible mass warrants further evaluation. In the right breast, no findings suspicious for malignancy. Images were processed with CAD. IMPRESSION: Further evaluation is suggested for possible mass in the left breast. RECOMMENDATION: Diagnostic mammogram and possibly ultrasound of the left breast. (Code:[id number]) The patient will be contacted regarding the findings, and additional imaging will be scheduled. BI-RADS CATEGORY  0: Incomplete. Need additional imaging evaluation and/or prior mammograms for comparison. Electronically Signed   By: Baird Lyons M.D.   On: 01/23/2020 10:04          Pelvic/Bimanual Pap is not indicated today    Smoking History: Patient has never smoked and was not referred to quit line.    Patient Navigation: Patient education provided. Access to services provided for patient through [facility]CP program. Byrd Hesselbach interpreter provided. No transportation provided   Colorectal Cancer Screening: Per patient has never had colonoscopy completed No complaints today. FIT test given.    Breast and Cervical Cancer Risk Assessment: Patient does not have family history of breast cancer, known genetic mutations, or radiation treatment to the chest before age 12. Patient does not have history of cervical dysplasia, immunocompromised, or DES exposure in-utero.  Risk Assessment   No risk assessment data for the current encounter  Risk Scores       03/05/2022   Last edited by: Joan Rutherford, LPN   5-year risk: 0.8 %   Lifetime risk: 7 %            A: BCCCP exam without pap smear No complaints with benign exam.   P: Referred patient to the [facility] of [facility] for a screening mammogram. Appointment scheduled 04/15/23.  Joan Lux, NP 04/15/2023 10:22 AM

## 2023-04-15 NOTE — Patient Instructions (Signed)
Taught Joan Poole how to perform BSE and gave educational materials to take home. Patient did not need a Pap smear today due to last Pap smear was in 09/18/19 per patient. Let her know BCCCP will cover Pap smears every 5 years unless has a history of abnormal Pap smears. Referred patient to the [facility] of [facility] for screening mammogram. Appointment scheduled for 04/15/23. Patient aware of appointment and will be there. Let patient know will follow up with her within the next couple weeks with results. Joan Poole verbalized understanding.  Pascal Lux, NP 10:34 AM

## 2023-04-21 LAB — FECAL OCCULT BLOOD, IMMUNOCHEMICAL: Fecal Occult Bld: NEGATIVE

## 2023-08-12 ENCOUNTER — Ambulatory Visit: Payer: Self-pay | Admitting: Hematology and Oncology

## 2023-08-12 VITALS — BP 141/79 | Wt 142.0 lb

## 2023-08-12 DIAGNOSIS — N6320 Unspecified lump in the left breast, unspecified quadrant: Secondary | ICD-10-CM

## 2023-08-12 NOTE — Progress Notes (Signed)
Joan Poole is a 52 y.o. female who presents to [facility] clinic today with no complaints.    Pap Smear: Pap not smear completed today. Last Pap smear was 09/18/2019 and was normal. Per patient has no history of an abnormal Pap smear. Last Pap smear result is available in Epic.   Physical exam: Breasts Breasts symmetrical. No skin abnormalities bilateral breasts. No nipple retraction bilateral breasts. No nipple discharge bilateral breasts. No lymphadenopathy. No lumps palpated right breasts. Left breast with mobile 3 cm mass noted at 11 o'clock.  MS 3D SCR MAMMO BILAT BR (aka MM)  Result Date: 04/19/2023 CLINICAL DATA:  Screening. EXAM: DIGITAL SCREENING BILATERAL MAMMOGRAM WITH TOMOSYNTHESIS AND CAD TECHNIQUE: Bilateral screening digital craniocaudal and mediolateral oblique mammograms were obtained. Bilateral screening digital breast tomosynthesis was performed. The images were evaluated with computer-aided detection. COMPARISON:  Previous exam(s). ACR Breast Density Category c: The breasts are heterogeneously dense, which may obscure small masses. FINDINGS: There are no findings suspicious for malignancy. IMPRESSION: No mammographic evidence of malignancy. A result letter of this screening mammogram will be mailed directly to the patient. RECOMMENDATION: Screening mammogram in one year. (Code:SM-B-01Y) BI-RADS CATEGORY  1: Negative. Electronically Signed   By: Elberta Fortis M.D.   On: 04/19/2023 13:57   MS DIGITAL SCREENING TOMO BILATERAL  Result Date: 03/10/2022 CLINICAL DATA:  Screening. EXAM: DIGITAL SCREENING BILATERAL MAMMOGRAM WITH TOMOSYNTHESIS AND CAD TECHNIQUE: Bilateral screening digital craniocaudal and mediolateral oblique mammograms were obtained. Bilateral screening digital breast tomosynthesis was performed. The images were evaluated with computer-aided detection. COMPARISON:  Previous exam(s). ACR Breast Density Category d: The breast tissue is extremely dense, which lowers  the sensitivity of mammography FINDINGS: There are no findings suspicious for malignancy. IMPRESSION: No mammographic evidence of malignancy. A result letter of this screening mammogram will be mailed directly to the patient. RECOMMENDATION: Screening mammogram in one year. (Code:SM-B-01Y) BI-RADS CATEGORY  1: Negative. Electronically Signed   By: Sherian Rein M.D.   On: 03/10/2022 08:50   MS DIGITAL SCREENING TOMO BILATERAL  Result Date: 02/03/2021 CLINICAL DATA:  Screening. EXAM: DIGITAL SCREENING BILATERAL MAMMOGRAM WITH TOMOSYNTHESIS AND CAD TECHNIQUE: Bilateral screening digital craniocaudal and mediolateral oblique mammograms were obtained. Bilateral screening digital breast tomosynthesis was performed. The images were evaluated with computer-aided detection. COMPARISON:  Previous exam(s). ACR Breast Density Category c: The breast tissue is heterogeneously dense, which may obscure small masses. FINDINGS: There are no findings suspicious for malignancy. IMPRESSION: No mammographic evidence of malignancy. A result letter of this screening mammogram will be mailed directly to the patient. RECOMMENDATION: Screening mammogram in one year. (Code:SM-B-01Y) BI-RADS CATEGORY  1: Negative. Electronically Signed   By: Bary Richard M.D.   On: 02/03/2021 16:17   MS DIGITAL DIAG TOMO UNI LEFT  Result Date: 02/16/2020 CLINICAL DATA:  Patient was called back from screening mammogram for possible masses in the left breast. EXAM: DIGITAL DIAGNOSTIC LEFT MAMMOGRAM WITH TOMO ULTRASOUND LEFT BREAST COMPARISON:  Previous exam(s). ACR Breast Density Category c: The breast tissue is heterogeneously dense, which may obscure small masses. FINDINGS: Additional imaging of the left breast was performed. There is a 2 cm mass in the lateral aspect of the left breast and a 2.6 cm mass in the medial aspect of the left breast. Targeted ultrasound is performed, showing a benign cyst in the left breast at 10 o'clock 5 cm from the  nipple measuring 2.1 x 0.7 x 2.2 cm. There is a benign anechoic cyst in the left breast  at 10 o'clock 3 cm from the nipple measuring 0.9 x 0.9 x 0.6 cm. There is a benign cluster of cysts (apocrine metaplasia) in the left breast at 3 o'clock 4 cm from the nipple measuring 1.3 x 0.7 x 0.6 cm. IMPRESSION: Left breast cysts.  No evidence of malignancy in the left breast. RECOMMENDATION: Bilateral screening mammogram in 1 year is recommended. I have discussed the findings and recommendations with the patient. If applicable, a reminder letter will be sent to the patient regarding the next appointment. BI-RADS CATEGORY  2: Benign. Electronically Signed   By: Baird Lyons M.D.   On: 02/16/2020 10:21   MS DIGITAL SCREENING TOMO BILATERAL  Result Date: 01/23/2020 CLINICAL DATA:  Screening. EXAM: DIGITAL SCREENING BILATERAL MAMMOGRAM WITH TOMO AND CAD COMPARISON:  None. ACR Breast Density Category c: The breast tissue is heterogeneously dense, which may obscure small masses. FINDINGS: In the left breast, a possible mass warrants further evaluation. In the right breast, no findings suspicious for malignancy. Images were processed with CAD. IMPRESSION: Further evaluation is suggested for possible mass in the left breast. RECOMMENDATION: Diagnostic mammogram and possibly ultrasound of the left breast. (Code:FI-L-51M) The patient will be contacted regarding the findings, and additional imaging will be scheduled. BI-RADS CATEGORY  0: Incomplete. Need additional imaging evaluation and/or prior mammograms for comparison. Electronically Signed   By: Baird Lyons M.D.   On: 01/23/2020 10:04          Pelvic/Bimanual Pap is not indicated today    Smoking History: Patient has never smoked and was not referred to quit line.    Patient Navigation: Patient education provided. Access to services provided for patient through [facility] program. Natale Lay interpreter provided. No transportation provided   Colorectal Cancer  Screening: Per patient has never had colonoscopy completed No complaints today. FIT test negative in 04/15/23   Breast and Cervical Cancer Risk Assessment: Patient does not have family history of breast cancer, known genetic mutations, or radiation treatment to the chest before age 18. Patient does not have history of cervical dysplasia, immunocompromised, or DES exposure in-utero.  Risk Scores as of Encounter on 08/12/2023     Joan Poole as of 04/15/2023           5-year 0.9%   Lifetime 7.92%   This patient is [place] but has no documented birth country, so the [place] model used data from [place] patients to calculate their risk score. Document a birth country in the Demographics activity for a more accurate score.         Last calculated by Caprice Red, CMA on 04/15/2023 at 10:44 AM        A: B[facility]CP exam without pap smear Complaint of left breast mass noted over the past few days. This is noted upon exam. She will have diagnostic exam tomorrow.   P: Referred patient to the [facility] for a diagnostic mammogram. Appointment scheduled 08/13/23.  Ilda Basset A, NP 08/12/2023 1:40 PM

## 2023-08-12 NOTE — Patient Instructions (Signed)
Taught Mena Goes about self breast awareness and gave educational materials to take home. Patient did not need a Pap smear today due to last Pap smear was in 09/18/2019 per patient.  Let her know B[facility]CP will cover Pap smears every 5 years unless has a history of abnormal Pap smears. Referred patient to the [facility] for screening mammogram. Appointment scheduled for 08/13/2023. Patient aware of appointment and will be there. Let patient know will follow up with her within the next couple weeks with results. Mena Goes verbalized understanding.  Pascal Lux, NP 1:41 PM

## 2023-08-18 ENCOUNTER — Other Ambulatory Visit: Payer: Self-pay | Admitting: Obstetrics and Gynecology

## 2024-03-29 ENCOUNTER — Telehealth: Payer: Self-pay

## 2024-03-29 NOTE — Telephone Encounter (Signed)
 Telephoned patient with interpreter#[id number] at mobile number. Left a voice message with BCCCP contact information. Daughter returned the call and left a message, her mother is not with her at this time.

## 2024-03-30 ENCOUNTER — Other Ambulatory Visit: Payer: Self-pay | Admitting: Obstetrics and Gynecology

## 2024-03-30 DIAGNOSIS — Z1231 Encounter for screening mammogram for malignant neoplasm of breast: Secondary | ICD-10-CM

## 2024-08-17 ENCOUNTER — Ambulatory Visit: Payer: Self-pay | Admitting: *Deleted

## 2024-08-17 ENCOUNTER — Ambulatory Visit
Admission: RE | Admit: 2024-08-17 | Discharge: 2024-08-17 | Disposition: A | Discharge status: Home / Self Care | Source: Ambulatory Visit | Attending: Obstetrics and Gynecology | Admitting: Obstetrics and Gynecology

## 2024-08-17 VITALS — BP 146/84 | Wt 143.0 lb

## 2024-08-17 DIAGNOSIS — N898 Other specified noninflammatory disorders of vagina: Secondary | ICD-10-CM

## 2024-08-17 DIAGNOSIS — Z1231 Encounter for screening mammogram for malignant neoplasm of breast: Secondary | ICD-10-CM

## 2024-08-17 DIAGNOSIS — Z01419 Encounter for gynecological examination (general) (routine) without abnormal findings: Secondary | ICD-10-CM

## 2024-08-17 DIAGNOSIS — Z1211 Encounter for screening for malignant neoplasm of colon: Secondary | ICD-10-CM

## 2024-08-17 NOTE — Progress Notes (Signed)
 [name] is a 53 y.o. [phone] female who presents to [facility] clinic today with no complaints.    Pap Smear: Pap smear completed today. Last Pap smear was 09/18/2019 at [facility] and was normal with negative HPV. Per patient has no history of an abnormal Pap smear. Last Pap smear result is available in Epic.    Physical exam: Breasts Breasts symmetrical. No skin abnormalities bilateral breasts. No nipple retraction bilateral breasts. No nipple discharge bilateral breasts. No lymphadenopathy. No lumps palpated bilateral breasts. No complaints of pain or tenderness on exam.   MS 3D SCR MAMMO BILAT BR (aka MM) Result Date: 04/19/2023 CLINICAL DATA:  Screening. EXAM: DIGITAL SCREENING BILATERAL MAMMOGRAM WITH TOMOSYNTHESIS AND CAD TECHNIQUE: Bilateral screening digital craniocaudal and mediolateral oblique mammograms were obtained. Bilateral screening digital breast tomosynthesis was performed. The images were evaluated with computer-aided detection. COMPARISON:  Previous exam(s). ACR Breast Density Category c: The breasts are heterogeneously dense, which may obscure small masses. FINDINGS: There are no findings suspicious for malignancy. IMPRESSION: No mammographic evidence of malignancy. A result letter of this screening mammogram will be mailed directly to the patient. RECOMMENDATION: Screening mammogram in one year. (Code:SM-B-01Y) BI-RADS CATEGORY  1: Negative. Electronically Signed   By: Toribio Agreste M.D.   On: 04/19/2023 13:57   MS DIGITAL SCREENING TOMO BILATERAL Result Date: 03/10/2022 CLINICAL DATA:  Screening. EXAM: DIGITAL SCREENING BILATERAL MAMMOGRAM WITH TOMOSYNTHESIS AND CAD TECHNIQUE: Bilateral screening digital craniocaudal and mediolateral oblique mammograms were obtained. Bilateral screening digital breast tomosynthesis was performed. The images were evaluated with computer-aided detection. COMPARISON:  Previous exam(s). ACR Breast  Density Category d: The breast tissue is extremely dense, which lowers the sensitivity of mammography FINDINGS: There are no findings suspicious for malignancy. IMPRESSION: No mammographic evidence of malignancy. A result letter of this screening mammogram will be mailed directly to the patient. RECOMMENDATION: Screening mammogram in one year. (Code:SM-B-01Y) BI-RADS CATEGORY  1: Negative. Electronically Signed   By: Craig Farr M.D.   On: 03/10/2022 08:50   MS DIGITAL SCREENING TOMO BILATERAL Result Date: 02/03/2021 CLINICAL DATA:  Screening. EXAM: DIGITAL SCREENING BILATERAL MAMMOGRAM WITH TOMOSYNTHESIS AND CAD TECHNIQUE: Bilateral screening digital craniocaudal and mediolateral oblique mammograms were obtained. Bilateral screening digital breast tomosynthesis was performed. The images were evaluated with computer-aided detection. COMPARISON:  Previous exam(s). ACR Breast Density Category c: The breast tissue is heterogeneously dense, which may obscure small masses. FINDINGS: There are no findings suspicious for malignancy. IMPRESSION: No mammographic evidence of malignancy. A result letter of this screening mammogram will be mailed directly to the patient. RECOMMENDATION: Screening mammogram in one year. (Code:SM-B-01Y) BI-RADS CATEGORY  1: Negative. Electronically Signed   By: Lael Hines M.D.   On: 02/03/2021 16:17   MS DIGITAL DIAG TOMO UNI LEFT Result Date: 02/16/2020 CLINICAL DATA:  Patient was called back from screening mammogram for possible masses in the left breast. EXAM: DIGITAL DIAGNOSTIC LEFT MAMMOGRAM WITH TOMO ULTRASOUND LEFT BREAST COMPARISON:  Previous exam(s). ACR Breast Density Category c: The breast tissue is heterogeneously dense, which may obscure small masses. FINDINGS: Additional imaging of the left breast was performed. There is a 2 cm mass in the lateral aspect of the left breast and a 2.6 cm mass in the medial aspect of the left breast. Targeted ultrasound is performed, showing  a benign cyst in the left breast at 10 o'clock 5 cm from the nipple measuring 2.1 x 0.7 x 2.2 cm. There is a benign  anechoic cyst in the left breast at 10 o'clock 3 cm from the nipple measuring 0.9 x 0.9 x 0.6 cm. There is a benign cluster of cysts (apocrine metaplasia) in the left breast at 3 o'clock 4 cm from the nipple measuring 1.3 x 0.7 x 0.6 cm. IMPRESSION: Left breast cysts.  No evidence of malignancy in the left breast. RECOMMENDATION: Bilateral screening mammogram in 1 year is recommended. I have discussed the findings and recommendations with the patient. If applicable, a reminder letter will be sent to the patient regarding the next appointment. BI-RADS CATEGORY  2: Benign. Electronically Signed   By: Dina  Arceo M.D.   On: 02/16/2020 10:21   MS DIGITAL SCREENING TOMO BILATERAL Result Date: 01/23/2020 CLINICAL DATA:  Screening. EXAM: DIGITAL SCREENING BILATERAL MAMMOGRAM WITH TOMO AND CAD COMPARISON:  None. ACR Breast Density Category c: The breast tissue is heterogeneously dense, which may obscure small masses. FINDINGS: In the left breast, a possible mass warrants further evaluation. In the right breast, no findings suspicious for malignancy. Images were processed with CAD. IMPRESSION: Further evaluation is suggested for possible mass in the left breast. RECOMMENDATION: Diagnostic mammogram and possibly ultrasound of the left breast. (Code:FI-L-33M) The patient will be contacted regarding the findings, and additional imaging will be scheduled. BI-RADS CATEGORY  0: Incomplete. Need additional imaging evaluation and/or prior mammograms for comparison. Electronically Signed   By: Dina  Arceo M.D.   On: 01/23/2020 10:04   Pelvic/Bimanual Ext Genitalia No lesions, no swelling and no discharge observed on external genitalia.        Vagina Vagina pink and normal texture. No lesions and thick white discharge observed in vagina. Wet prep completed.       Cervix Cervix is present. Cervix pink and of  normal texture. Thick white discharge observed on cervix.    Uterus Uterus is present and palpable. Uterus is slightly prolapsed position and normal size.        Adnexae Bilateral ovaries present and palpable. No tenderness on palpation.         Rectovaginal No rectal exam completed today since patient had no rectal complaints. No skin abnormalities observed on exam.     Smoking History: Patient has never smoked.   Patient Navigation: Patient education provided. Access to services provided for patient through [place] program. Spanish interpreter Bernice Angry from [facility] provided.   Colorectal Cancer Screening: Per patient has never had colonoscopy completed. FIT Test completed 04/15/2023 that was negative. FIT test given to patient to complete today. No complaints today.    Breast and Cervical Cancer Risk Assessment: Patient does not have family history of breast cancer, known genetic mutations, or radiation treatment to the chest before age 69. Patient does not have history of cervical dysplasia, immunocompromised, or DES exposure in-utero.  Risk Scores as of Encounter on 08/17/2024     Joan Poole           5-year 0.94%   Lifetime 7.78%   This patient is [place] but has no documented birth country, so the [place] model used data from [place] patients to calculate their risk score. Document a birth country in the Demographics activity for a more accurate score.         Last calculated by Silas, Joan Poole, CMA on 08/17/2024 at  9:35 AM        A: BCCCP exam with pap smear No complaints.  P: Referred patient to the [facility] for a screening mammogram on mobile unit. Appointment scheduled  Thursday, August 17, 2024 at 1000.   Driscilla Wanda SQUIBB, RN 08/17/2024 9:38 AM

## 2024-08-17 NOTE — Patient Instructions (Signed)
 Explained breast self awareness with Joan Poole Joan Poole. Pap smear completed today. Let her know B[facility]CP will cover Pap smears and HPV typing every 5 years unless has a history of abnormal Pap smears. Referred patient to the [facility] for a screening mammogram on mobile unit. Appointment scheduled Thursday, August 17, 2024 at 1000. Patient aware of appointment and will be there. Let patient know will follow up with her within the next couple weeks with results of Pap smear and Wet prep by phone. Informed patient that the Breast Center will follow up with her within the next couple of weeks with results of her mammogram by letter or phone. Joan Poole Joan Poole verbalized understanding.  Lorianne Malbrough, Wanda Ship, RN 9:38 AM

## 2024-08-18 LAB — CERVICOVAGINAL ANCILLARY ONLY
Bacterial Vaginitis (gardnerella): NEGATIVE
Candida Glabrata: NEGATIVE
Candida Vaginitis: NEGATIVE
Comment: NEGATIVE
Comment: NEGATIVE
Comment: NEGATIVE
Comment: NEGATIVE
Trichomonas: NEGATIVE

## 2024-08-22 LAB — CYTOLOGY - PAP
Comment: NEGATIVE
Diagnosis: NEGATIVE
High risk HPV: NEGATIVE

## 2024-08-28 ENCOUNTER — Ambulatory Visit: Payer: Self-pay

## 2024-08-28 DIAGNOSIS — Z1211 Encounter for screening for malignant neoplasm of colon: Secondary | ICD-10-CM

## 2024-11-16 LAB — FECAL OCCULT BLOOD, IMMUNOCHEMICAL

## 2024-11-27 NOTE — Addendum Note (Signed)
 Addended by: Alexina Niccoli K on: 11/27/2024 12:54 PM   Modules accepted: Orders

## 2024-11-27 NOTE — Telephone Encounter (Signed)
 Using [address], [place], [place]: [id number], I have spoken with the pt and advised that the lab cancelled her test. It is likely she forgot to write the date she collected the sample or mailed it after it was expired.  I advised the pt I can mail her another kit, she was in agreement with this. I have mailed the pt another FIT test.
